# Patient Record
Sex: Female | Born: 1970 | Race: White | Hispanic: No | Marital: Married | Smoking: Current every day smoker
Health system: Southern US, Community
[De-identification: ages and names within clinical notes are randomized; demographics above are authoritative.]

## PROBLEM LIST (undated history)

## (undated) DIAGNOSIS — E559 Vitamin D deficiency, unspecified: Secondary | ICD-10-CM

## (undated) DIAGNOSIS — I340 Nonrheumatic mitral (valve) insufficiency: Secondary | ICD-10-CM

## (undated) DIAGNOSIS — I10 Essential (primary) hypertension: Secondary | ICD-10-CM

## (undated) HISTORY — PX: CYST EXCISION: SHX5701

## (undated) HISTORY — DX: Vitamin D deficiency, unspecified: E55.9

## (undated) HISTORY — PX: ABDOMINAL HYSTERECTOMY: SHX81

## (undated) HISTORY — DX: Essential (primary) hypertension: I10

## (undated) HISTORY — PX: TUBAL LIGATION: SHX77

---

## 2014-07-23 ENCOUNTER — Other Ambulatory Visit (HOSPITAL_COMMUNITY): Payer: Self-pay | Admitting: Family Medicine

## 2014-07-23 DIAGNOSIS — Z1231 Encounter for screening mammogram for malignant neoplasm of breast: Secondary | ICD-10-CM

## 2014-07-27 ENCOUNTER — Ambulatory Visit (HOSPITAL_COMMUNITY)
Admission: RE | Admit: 2014-07-27 | Discharge: 2014-07-27 | Disposition: A | Discharge status: Home / Self Care | Payer: BC Managed Care – PPO | Source: Ambulatory Visit | Attending: Family Medicine | Admitting: Family Medicine

## 2014-07-27 DIAGNOSIS — Z1231 Encounter for screening mammogram for malignant neoplasm of breast: Secondary | ICD-10-CM | POA: Insufficient documentation

## 2014-09-23 ENCOUNTER — Emergency Department (HOSPITAL_COMMUNITY): Payer: BLUE CROSS/BLUE SHIELD

## 2014-09-23 ENCOUNTER — Encounter (HOSPITAL_COMMUNITY): Payer: Self-pay | Admitting: Emergency Medicine

## 2014-09-23 ENCOUNTER — Emergency Department (HOSPITAL_COMMUNITY)
Admission: EM | Admit: 2014-09-23 | Discharge: 2014-09-24 | Disposition: A | Discharge status: Home / Self Care | Payer: BLUE CROSS/BLUE SHIELD | Attending: Emergency Medicine | Admitting: Emergency Medicine

## 2014-09-23 DIAGNOSIS — Y998 Other external cause status: Secondary | ICD-10-CM | POA: Diagnosis not present

## 2014-09-23 DIAGNOSIS — S80211A Abrasion, right knee, initial encounter: Secondary | ICD-10-CM | POA: Diagnosis not present

## 2014-09-23 DIAGNOSIS — W108XXA Fall (on) (from) other stairs and steps, initial encounter: Secondary | ICD-10-CM | POA: Diagnosis not present

## 2014-09-23 DIAGNOSIS — F1010 Alcohol abuse, uncomplicated: Secondary | ICD-10-CM | POA: Diagnosis not present

## 2014-09-23 DIAGNOSIS — H1132 Conjunctival hemorrhage, left eye: Secondary | ICD-10-CM | POA: Diagnosis not present

## 2014-09-23 DIAGNOSIS — Y9289 Other specified places as the place of occurrence of the external cause: Secondary | ICD-10-CM | POA: Insufficient documentation

## 2014-09-23 DIAGNOSIS — S199XXA Unspecified injury of neck, initial encounter: Secondary | ICD-10-CM | POA: Insufficient documentation

## 2014-09-23 DIAGNOSIS — Z23 Encounter for immunization: Secondary | ICD-10-CM | POA: Diagnosis not present

## 2014-09-23 DIAGNOSIS — S80212A Abrasion, left knee, initial encounter: Secondary | ICD-10-CM | POA: Diagnosis not present

## 2014-09-23 DIAGNOSIS — S0031XA Abrasion of nose, initial encounter: Secondary | ICD-10-CM | POA: Insufficient documentation

## 2014-09-23 DIAGNOSIS — M542 Cervicalgia: Secondary | ICD-10-CM

## 2014-09-23 DIAGNOSIS — Y9389 Activity, other specified: Secondary | ICD-10-CM | POA: Insufficient documentation

## 2014-09-23 DIAGNOSIS — S50811A Abrasion of right forearm, initial encounter: Secondary | ICD-10-CM | POA: Diagnosis not present

## 2014-09-23 DIAGNOSIS — W19XXXA Unspecified fall, initial encounter: Secondary | ICD-10-CM

## 2014-09-23 LAB — CARBOXYHEMOGLOBIN
Carboxyhemoglobin: 6.6 % (ref 0.5–1.5)
Methemoglobin: 1 % (ref 0.0–1.5)
O2 SAT: 94.2 %
Total hemoglobin: 16.6 g/dL — ABNORMAL HIGH (ref 12.0–16.0)

## 2014-09-23 LAB — COMPREHENSIVE METABOLIC PANEL
ALK PHOS: 57 U/L (ref 39–117)
ALT: 44 U/L — ABNORMAL HIGH (ref 0–35)
ANION GAP: 17 — AB (ref 5–15)
AST: 36 U/L (ref 0–37)
Albumin: 4.2 g/dL (ref 3.5–5.2)
BUN: 11 mg/dL (ref 6–23)
CO2: 17 mmol/L — ABNORMAL LOW (ref 19–32)
Calcium: 9.3 mg/dL (ref 8.4–10.5)
Chloride: 107 mEq/L (ref 96–112)
Creatinine, Ser: 0.78 mg/dL (ref 0.50–1.10)
GFR calc non Af Amer: >90 mL/min (ref >90)
GLUCOSE: 88 mg/dL (ref 70–99)
Potassium: 3.2 mmol/L — ABNORMAL LOW (ref 3.5–5.1)
Sodium: 141 mmol/L (ref 135–145)
Total Bilirubin: 0.3 mg/dL (ref 0.3–1.2)
Total Protein: 8.3 g/dL (ref 6.0–8.3)

## 2014-09-23 LAB — CBC WITH DIFFERENTIAL/PLATELET
BASOS ABS: 0 10*3/uL (ref 0.0–0.1)
BASOS PCT: 0 % (ref 0–1)
Eosinophils Absolute: 0.1 10*3/uL (ref 0.0–0.7)
Eosinophils Relative: 1 % (ref 0–5)
HEMATOCRIT: 46 % (ref 36.0–46.0)
Hemoglobin: 16.1 g/dL — ABNORMAL HIGH (ref 12.0–15.0)
Lymphocytes Relative: 17 % (ref 12–46)
Lymphs Abs: 2.3 10*3/uL (ref 0.7–4.0)
MCH: 32.9 pg (ref 26.0–34.0)
MCHC: 35 g/dL (ref 30.0–36.0)
MCV: 94.1 fL (ref 78.0–100.0)
Monocytes Absolute: 0.9 10*3/uL (ref 0.1–1.0)
Monocytes Relative: 6 % (ref 3–12)
NEUTROS ABS: 10.2 10*3/uL — AB (ref 1.7–7.7)
Neutrophils Relative %: 76 % (ref 43–77)
Platelets: 236 10*3/uL (ref 150–400)
RBC: 4.89 MIL/uL (ref 3.87–5.11)
RDW: 12.7 % (ref 11.5–15.5)
WBC: 13.6 10*3/uL — ABNORMAL HIGH (ref 4.0–10.5)

## 2014-09-23 LAB — LACTIC ACID, PLASMA: Lactic Acid, Venous: 3.7 mmol/L — ABNORMAL HIGH (ref 0.5–2.2)

## 2014-09-23 MED ORDER — SODIUM CHLORIDE 0.9 % IV BOLUS (SEPSIS)
1000.0000 mL | Freq: Once | INTRAVENOUS | Status: AC
  Administered 2014-09-23: 1000 mL via INTRAVENOUS

## 2014-09-23 MED ORDER — SODIUM CHLORIDE 0.9 % IV BOLUS (SEPSIS)
1000.0000 mL | Freq: Once | INTRAVENOUS | Status: AC
  Administered 2014-09-24: 1000 mL via INTRAVENOUS

## 2014-09-23 MED ORDER — TETANUS-DIPHTH-ACELL PERTUSSIS 5-2.5-18.5 LF-MCG/0.5 IM SUSP
0.5000 mL | Freq: Once | INTRAMUSCULAR | Status: AC
  Administered 2014-09-24: 0.5 mL via INTRAMUSCULAR
  Filled 2014-09-23: qty 0.5
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
## 2014-09-23 NOTE — ED Notes (Signed)
Pt monitored by pulse ox, bp cuff, and 12-lead. Pt placed on 15LPM non-rebreather per RN.

## 2014-09-23 NOTE — ED Notes (Signed)
Pt remains on non-rebreather at this time

## 2014-09-24 LAB — ETHANOL: ALCOHOL ETHYL (B): 164 mg/dL — AB (ref 0–9)

## 2014-09-24 MED ORDER — ONDANSETRON HCL 4 MG/2ML IJ SOLN
4.0000 mg | Freq: Once | INTRAMUSCULAR | Status: AC
  Administered 2014-09-24: 4 mg via INTRAVENOUS
  Filled 2014-09-24: qty 2

## 2014-09-24 MED ORDER — IBUPROFEN 400 MG PO TABS
600.0000 mg | ORAL_TABLET | Freq: Once | ORAL | Status: AC
  Administered 2014-09-24: 600 mg via ORAL
  Filled 2014-09-24 (×2): qty 1

## 2014-09-24 MED ORDER — POTASSIUM CHLORIDE CRYS ER 20 MEQ PO TBCR
40.0000 meq | EXTENDED_RELEASE_TABLET | Freq: Once | ORAL | Status: AC
  Administered 2014-09-24: 40 meq via ORAL
  Filled 2014-09-24: qty 2

## 2014-09-24 NOTE — Discharge Instructions (Signed)
Wear your cervical collar at all times.  If you no longer have neck pain you may remove your collar, however if it does not improve see your doctor in 1 week. Cervical Sprain A cervical sprain is an injury in the neck in which the strong, fibrous tissues (ligaments) that connect your neck bones stretch or tear. Cervical sprains can range from mild to severe. Severe cervical sprains can cause the neck vertebrae to be unstable. This can lead to damage of the spinal cord and can result in serious nervous system problems. The amount of time it takes for a cervical sprain to get better depends on the cause and extent of the injury. Most cervical sprains heal in 1 to 3 weeks. CAUSES  Severe cervical sprains may be caused by:   Contact sport injuries (such as from football, rugby, wrestling, hockey, auto racing, gymnastics, diving, martial arts, or boxing).   Motor vehicle collisions.   Whiplash injuries. This is an injury from a sudden forward and backward whipping movement of the head and neck.  Falls.  Mild cervical sprains may be caused by:   Being in an awkward position, such as while cradling a telephone between your ear and shoulder.   Sitting in a chair that does not offer proper support.   Working at a poorly Marketing executivedesigned computer station.   Looking up or down for long periods of time.  SYMPTOMS   Pain, soreness, stiffness, or a burning sensation in the front, back, or sides of the neck. This discomfort may develop immediately after the injury or slowly, 24 hours or more after the injury.   Pain or tenderness directly in the middle of the back of the neck.   Shoulder or upper back pain.   Limited ability to move the neck.   Headache.   Dizziness.   Weakness, numbness, or tingling in the hands or arms.   Muscle spasms.   Difficulty swallowing or chewing.   Tenderness and swelling of the neck.  DIAGNOSIS  Most of the time your health care provider can  diagnose a cervical sprain by taking your history and doing a physical exam. Your health care provider will ask about previous neck injuries and any known neck problems, such as arthritis in the neck. X-rays may be taken to find out if there are any other problems, such as with the bones of the neck. Other tests, such as a CT scan or MRI, may also be needed.  TREATMENT  Treatment depends on the severity of the cervical sprain. Mild sprains can be treated with rest, keeping the neck in place (immobilization), and pain medicines. Severe cervical sprains are immediately immobilized. Further treatment is done to help with pain, muscle spasms, and other symptoms and may include:  Medicines, such as pain relievers, numbing medicines, or muscle relaxants.   Physical therapy. This may involve stretching exercises, strengthening exercises, and posture training. Exercises and improved posture can help stabilize the neck, strengthen muscles, and help stop symptoms from returning.  HOME CARE INSTRUCTIONS   Put ice on the injured area.   Put ice in a plastic bag.   Place a towel between your skin and the bag.   Leave the ice on for 15-20 minutes, 3-4 times a day.   If your injury was severe, you may have been given a cervical collar to wear. A cervical collar is a two-piece collar designed to keep your neck from moving while it heals.  Do not remove the collar unless  instructed by your health care provider.  If you have long hair, keep it outside of the collar.  Ask your health care provider before making any adjustments to your collar. Minor adjustments may be required over time to improve comfort and reduce pressure on your chin or on the back of your head.  Ifyou are allowed to remove the collar for cleaning or bathing, follow your health care provider's instructions on how to do so safely.  Keep your collar clean by wiping it with mild soap and water and drying it completely. If the collar  you have been given includes removable pads, remove them every 1-2 days and hand wash them with soap and water. Allow them to air dry. They should be completely dry before you wear them in the collar.  If you are allowed to remove the collar for cleaning and bathing, wash and dry the skin of your neck. Check your skin for irritation or sores. If you see any, tell your health care provider.  Do not drive while wearing the collar.   Only take over-the-counter or prescription medicines for pain, discomfort, or fever as directed by your health care provider.   Keep all follow-up appointments as directed by your health care provider.   Keep all physical therapy appointments as directed by your health care provider.   Make any needed adjustments to your workstation to promote good posture.   Avoid positions and activities that make your symptoms worse.   Warm up and stretch before being active to help prevent problems.  SEEK MEDICAL CARE IF:   Your pain is not controlled with medicine.   You are unable to decrease your pain medicine over time as planned.   Your activity level is not improving as expected.  SEEK IMMEDIATE MEDICAL CARE IF:   You develop any bleeding.  You develop stomach upset.  You have signs of an allergic reaction to your medicine.   Your symptoms get worse.   You develop new, unexplained symptoms.   You have numbness, tingling, weakness, or paralysis in any part of your body.  MAKE SURE YOU:   Understand these instructions.  Will watch your condition.  Will get help right away if you are not doing well or get worse. Document Released: 06/28/2007 Document Revised: 09/05/2013 Document Reviewed: 03/08/2013 [facility] Patient Information 2015 [place], [place]. This information is not intended to replace advice given to you by your health care provider. Make sure you discuss any questions you have with your health care provider.

## 2015-03-15 DIAGNOSIS — I340 Nonrheumatic mitral (valve) insufficiency: Secondary | ICD-10-CM

## 2015-03-15 HISTORY — DX: Nonrheumatic mitral (valve) insufficiency: I34.0

## 2015-05-06 ENCOUNTER — Encounter: Payer: Self-pay | Admitting: Obstetrics and Gynecology

## 2015-05-06 ENCOUNTER — Ambulatory Visit (INDEPENDENT_AMBULATORY_CARE_PROVIDER_SITE_OTHER): Payer: BLUE CROSS/BLUE SHIELD | Admitting: Obstetrics and Gynecology

## 2015-05-06 VITALS — BP 120/74 | Ht 64.0 in | Wt 154.0 lb

## 2015-05-06 DIAGNOSIS — N941 Dyspareunia: Secondary | ICD-10-CM

## 2015-05-06 DIAGNOSIS — N812 Incomplete uterovaginal prolapse: Secondary | ICD-10-CM | POA: Diagnosis not present

## 2015-05-06 DIAGNOSIS — IMO0002 Reserved for concepts with insufficient information to code with codable children: Secondary | ICD-10-CM

## 2015-05-06 NOTE — Progress Notes (Signed)
[facility] Clinic Visit  Patient name: Natalie Butler MRN [id number]  Date of birth: 13-Jun-1971  CC & HPI:  Natalie Butler is a 44 y.o. female presenting today for dyspareunia that has been problematic for about 5 years. Patient was sent here by Dr. Lilly Cove. Patient states the pain feels like her partner is hitting something inside, and the discomfort stays unchanged throughout intercourse. She notes even wearing tampons makes it feel like something inside her is being hit. Patient's partner has been using Cialis. Patient herself hasn't tried any treatments or medications for her problems.    Patient has had 4 children ([id number]), delivered vaginally; 3 were delivered early at 35 weeks, with the lightest at 6 lbs.   Patient notes occasional stress incontinence with sneezing or coughing; otherwise, she denies any urinary symptoms.   Patient reports menstrual cramping during the first 2-3 days with her periods that last 4-7 days on average. She notes blood clots for 3-4 days.  She denies menorrhagia.   Patient's last normal pap smear was in November 2015 by her PCP Dr. Theodis Shove at [facility].   She states she has no children with her current partner.   ROS:  A complete review of systems was obtained and all systems are negative except as noted in the HPI and PMH.   +occasional stress incontinence +menstrual cramping +dyspareunia  Pertinent History Reviewed:   Reviewed: Significant for tubal ligation Medical         Past Medical History  Diagnosis Date  . Hypertension                               Surgical Hx:    Past Surgical History  Procedure Laterality Date  . Tubal ligation    . Cyst excision     Medications: Reviewed & Updated - see associated section                       Current outpatient prescriptions:  .  Cholecalciferol (VITAMIN D PO), Take 1 tablet by mouth daily., Disp: , Rfl:  .  lisinopril (PRINIVIL,ZESTRIL) 2.5 MG tablet, Take 2.5 mg  by mouth daily., Disp: , Rfl:    Social History: Reviewed -  reports that she has been smoking Cigarettes.  She has a 15 pack-year smoking history. She has never used smokeless tobacco.  Objective Findings:  Vitals: Blood pressure 120/74, height  (1.626 m), weight 154 lb (69.854 kg), last menstrual period 04/15/2014.  Physical Examination: General appearance - alert, well appearing, and in no distress, oriented to person, place, and time and normal appearing weight Mental status - alert, oriented to person, place, and time, normal mood, behavior, speech, dress, motor activity, and thought processes, affect appropriate to mood Pelvic - normal external genitalia  VULVA: normal appearing vulva with no masses, tenderness or lesions,  VAGINA: normal appearing vagina with normal color and discharge, no lesions,  CERVIX: normal appearing cervix without discharge or lesions, appears 4 cm wide  UTERUS: uterus is anteflexed, normal size, first degree uterine descensus ADNEXA: normal adnexa in size, nontender and no masses  Assessment & Plan:   A:  1. First degree uterine descensus 2. Dyspareunia due to uterine contact  P:  1. Discuss anatomy, position changes  2. Consider surgical options if pain considered debilitating 3 vag u/s in 1 wk  This chart was SCRIBED for Christin Bach, MD  by Ronney Lion, ED Scribe. This patient was seen in room 1, and the patient's care was started at 10:27 AM.  I personally performed the services described in this documentation, which was SCRIBED in my presence. The recorded information has been reviewed and considered accurate. It has been edited as necessary during review. Tilda Burrow, MD

## 2015-05-06 NOTE — Progress Notes (Signed)
Patient ID: Natalie Butler, female   DOB: Nov 08, 1970, 44 y.o.   MRN: [id number] Pt here today for painful intercourse. Pt states that she has had this for about 3 years. Pt states that things are much worse now than before. Pt states that she has not tried anything, as far as medications to help with this problem.

## 2015-05-08 DIAGNOSIS — IMO0002 Reserved for concepts with insufficient information to code with codable children: Secondary | ICD-10-CM | POA: Insufficient documentation

## 2015-05-10 ENCOUNTER — Other Ambulatory Visit: Payer: Self-pay | Admitting: Obstetrics and Gynecology

## 2015-05-10 DIAGNOSIS — IMO0002 Reserved for concepts with insufficient information to code with codable children: Secondary | ICD-10-CM

## 2015-05-13 ENCOUNTER — Ambulatory Visit (INDEPENDENT_AMBULATORY_CARE_PROVIDER_SITE_OTHER): Payer: BLUE CROSS/BLUE SHIELD | Admitting: Obstetrics and Gynecology

## 2015-05-13 ENCOUNTER — Ambulatory Visit (INDEPENDENT_AMBULATORY_CARE_PROVIDER_SITE_OTHER): Payer: BLUE CROSS/BLUE SHIELD

## 2015-05-13 ENCOUNTER — Encounter: Payer: Self-pay | Admitting: Obstetrics and Gynecology

## 2015-05-13 ENCOUNTER — Other Ambulatory Visit: Payer: Self-pay | Admitting: Obstetrics and Gynecology

## 2015-05-13 VITALS — BP 140/92 | Ht 64.0 in | Wt 156.0 lb

## 2015-05-13 DIAGNOSIS — IMO0002 Reserved for concepts with insufficient information to code with codable children: Secondary | ICD-10-CM

## 2015-05-13 DIAGNOSIS — N941 Dyspareunia: Secondary | ICD-10-CM

## 2015-05-13 NOTE — Progress Notes (Signed)
Patient ID: Natalie Butler, female   DOB: February 29, 1971, 44 y.o.   MRN: [id number]   [facility] Visit  Patient name: Natalie Butler MRN [id number]  Date of birth: [birth year]  CC & HPI:  Natalie Butler is a 44 y.o. female presenting today for follow-up on tests completed for dyspareunia and consult regarding having a hysterectomy. Pt denies any new problems. Pt denies ongoing problems with urinary incontinency, however, reports experiencing mild urinary incontinency with laughing, coughing and sneezing. Pt denies her urinary Sx necessitate the use of pads.   Pt reports she started her menstrual cycle today.   ROS:  10 Systems reviewed and all are negative for acute change except as noted in the HPI. Pertinent History Reviewed:   Reviewed: Significant for tubal ligation and cyst excision.  Medical         Past Medical History  Diagnosis Date  . Hypertension                               Surgical Hx:    Past Surgical History  Procedure Laterality Date  . Tubal ligation    . Cyst excision     Medications: Reviewed & Updated - see associated section                       Current outpatient prescriptions:  .  Cholecalciferol (VITAMIN D PO), Take 1 tablet by mouth daily., Disp: , Rfl:  .  lisinopril (PRINIVIL,ZESTRIL) 2.5 MG tablet, Take 2.5 mg by mouth daily., Disp: , Rfl:   Social History: Reviewed -  reports that she has been smoking Cigarettes.  She has a 15 pack-year smoking history. She has never used smokeless tobacco.  Objective Findings:  Vitals: Blood pressure 140/92, height  (1.626 m), weight 156 lb (70.761 kg), last menstrual period 04/15/2014.  Office visit only: consult for hysterectomy.   Assessment & Plan:   A:   1. Ultrasound results shows small anterior uterus with normal endometrium, tubes and ovaries  2. Benefits and drawbacks of Total Abdominal Hysterectomy with Bilateral Salpingo-Oophorectomy.   P:  1. Hysterectomy to be scheduled in October.  2.  Leaving ovaries intact.  I personally performed the services described in this documentation, which was SCRIBED in my presence. The recorded information has been reviewed and considered accurate. It has been edited as necessary during review. Tilda Burrow, MD   This chart was SCRIBED for Christin Bach, MD by Marica Otter, ED Scribe. This patient was seen in the office, and the patient's care was started at 1:57 PM.  I personally performed the services described in this documentation, which was SCRIBED in my presence. The recorded information has been reviewed and considered accurate. It has been edited as necessary during review. Tilda Burrow, MD

## 2015-05-13 NOTE — Progress Notes (Signed)
US PELVIS:normal anteverted uterus,EEC 6.1mm,normal ov's bilat,no free fluid,mult.small nabothian cysts,ov's appear to be mobile,pelvic pain during ultrasound

## 2015-05-13 NOTE — Progress Notes (Signed)
Patient ID: Natalie Butler, female   DOB: 09-22-1970, 44 y.o.   MRN: [id number] Pt here today for follow up. Pt to go over results and find out what next step is.

## 2015-05-22 ENCOUNTER — Ambulatory Visit (INDEPENDENT_AMBULATORY_CARE_PROVIDER_SITE_OTHER): Payer: BLUE CROSS/BLUE SHIELD | Admitting: Obstetrics and Gynecology

## 2015-05-22 ENCOUNTER — Other Ambulatory Visit: Payer: Self-pay | Admitting: Obstetrics and Gynecology

## 2015-05-22 ENCOUNTER — Telehealth: Payer: Self-pay | Admitting: Obstetrics and Gynecology

## 2015-05-22 ENCOUNTER — Encounter: Payer: Self-pay | Admitting: Obstetrics and Gynecology

## 2015-05-22 VITALS — BP 130/82 | Ht 64.0 in | Wt 156.5 lb

## 2015-05-22 VITALS — BP 130/92

## 2015-05-22 DIAGNOSIS — N939 Abnormal uterine and vaginal bleeding, unspecified: Secondary | ICD-10-CM

## 2015-05-22 DIAGNOSIS — N920 Excessive and frequent menstruation with regular cycle: Secondary | ICD-10-CM

## 2015-05-22 DIAGNOSIS — Z01818 Encounter for other preprocedural examination: Secondary | ICD-10-CM

## 2015-05-22 DIAGNOSIS — Z3200 Encounter for pregnancy test, result unknown: Secondary | ICD-10-CM

## 2015-05-22 NOTE — Progress Notes (Signed)
This chart was scribed for Tilda Burrow, MD by Jarvis Morgan, ED Scribe. This patient was seen in room 1 and the patient's care was started at 12:09 PM.   [facility] Clinic Visit  Patient name: Natalie Butler MRN [id number]  Date of birth: 1971-07-29  CC & HPI:  Natalie Butler is a 44 y.o. female presenting today for endometrial biopsy. Pt was unable to void urine at this visit. UPT was not run. Pt has h/o tubal ligation. Her menstrual periods are regular. Pt has been sexually active since her last menstrual period. [place] on 8/29 was negative. She denies any other symptoms.  LMP 8/29 ROS:  10 Systems reviewed and all are negative for acute change except as noted in the HPI. Alcohol intake is 1 drink, 1-2x per week   Pertinent History Reviewed:   Reviewed: Significant for HTN and tubal ligation Medical         Past Medical History  Diagnosis Date  . Hypertension                               Surgical Hx:    Past Surgical History  Procedure Laterality Date  . Tubal ligation    . Cyst excision     Medications: Reviewed & Updated - see associated section                       Current outpatient prescriptions:  .  Cholecalciferol (VITAMIN D PO), Take 1 tablet by mouth daily., Disp: , Rfl:  .  lisinopril (PRINIVIL,ZESTRIL) 2.5 MG tablet, Take 2.5 mg by mouth daily., Disp: , Rfl:    Social History: Reviewed -  reports that she has been smoking Cigarettes.  She has a 15 pack-year smoking history. She has never used smokeless tobacco.  Objective Findings:  Vitals: Blood pressure 130/82, height  (1.626 m), weight 156 lb 8 oz (70.988 kg), last menstrual period 05/13/2015.  Endometrial Biopsy: Patient given informed consent, signed copy in the chart, time out was performed. Time out taken. The patient was placed in the lithotomy position and the cervix brought into view with sterile speculum.  Portio of cervix cleansed x 2 with betadine swabs.  A tenaculum was placed in the anterior  lip of the cervix. The uterus was sounded for depth of 9 cm Milex uterine Explora 3 mm was introduced to into the uterus, suction created,  and an endometrial sample was obtained. All equipment was removed and accounted for.   The patient tolerated the procedure well.    Patient given post procedure instructions.  Followup: by phone   Assessment & Plan:   A:  1. Ultrasound results shows small anterior uterus with normal endometrium, tubes and ovaries 2. Endometrial biopsy done today  P:  1. Will schedule vaginal hysterectomy for sometime after 06/03/15 2. Will call with results of endometrial biopsy by 05/27/15  .I personally performed the services described in this documentation, which was SCRIBED in my presence. The recorded information has been reviewed and considered accurate. It has been edited as necessary during review. Tilda Burrow, MD

## 2015-05-22 NOTE — Telephone Encounter (Signed)
Pt states that she is still bleeding pretty heavy and she is having pain on her left side.

## 2015-05-22 NOTE — Telephone Encounter (Signed)
Pt advised to come in to the office now and be seen again.

## 2015-05-22 NOTE — Progress Notes (Signed)
This chart was scribed for Tilda Burrow, MD by Jarvis Morgan, ED Scribe. This patient was seen in room 1 and the patient's care was started at 3:02 PM.    [facility] Clinic Visit  Patient name: Natalie Butler MRN [id number]  Date of birth: [birth year]  CC & HPI:  Natalie Butler is a 44 y.o. female presenting today for vaginal bleeding s/p endometrial biopsy this AM. Pt has h/o tubal ligation. Her menstrual periods are regular. Pt has been sexually active since her last menstrual period. [place] on 8/29 was negative. She denies any other symptoms.  LMP 8/29   ROS:  10 Systems reviewed and all are negative for acute change except as noted in the HPI.  Pertinent History Reviewed:   Reviewed: Significant for HTN Medical         Past Medical History  Diagnosis Date  . Hypertension                               Surgical Hx:    Past Surgical History  Procedure Laterality Date  . Tubal ligation    . Cyst excision     Medications: Reviewed & Updated - see associated section                       Current outpatient prescriptions:  .  Cholecalciferol (VITAMIN D PO), Take 1 tablet by mouth daily., Disp: , Rfl:  .  lisinopril (PRINIVIL,ZESTRIL) 2.5 MG tablet, Take 2.5 mg by mouth daily., Disp: , Rfl:    Social History: Reviewed -  reports that she has been smoking Cigarettes.  She has a 15 pack-year smoking history. She has never used smokeless tobacco.  Objective Findings:  Vitals: Blood pressure 130/92, last menstrual period 05/13/2015.  Physical Examination: General appearance - alert, well appearing, and in no distress Pelvic - normal external genitalia VAGINA: normal appearing vagina with normal color. Minimal blood present no clots.  Impression normal post op s/p endometrial biospy   Assessment & Plan:   A:  1. S/p endometrial biopsy resolved bleeding P:  1. F/u per prior directions  I personally performed the services described in this documentation, which was SCRIBED in  my presence. The recorded information has been reviewed and considered accurate. It has been edited as necessary during review. Tilda Burrow, MD

## 2015-05-22 NOTE — Progress Notes (Signed)
Patient ID: Natalie Butler, female   DOB: 01-Aug-1971, 44 y.o.   MRN: [id number] Pt here today for Endometrial biopsy. Pt states that she is unable to void at this time. UPT was not able to be collected.

## 2015-05-28 ENCOUNTER — Telehealth: Payer: Self-pay | Admitting: *Deleted

## 2015-05-28 NOTE — Telephone Encounter (Signed)
Pt called wanting results of biopsy. Pt is aware that her results were normal and negative. Pt wanted to know what the next step is and what else does she need to do before her surgery.

## 2015-06-12 ENCOUNTER — Other Ambulatory Visit: Payer: Self-pay | Admitting: Obstetrics and Gynecology

## 2015-06-13 ENCOUNTER — Encounter (HOSPITAL_COMMUNITY): Payer: Self-pay

## 2015-06-13 ENCOUNTER — Encounter (HOSPITAL_COMMUNITY)
Admission: RE | Admit: 2015-06-13 | Discharge: 2015-06-13 | Disposition: A | Discharge status: Home / Self Care | Payer: BLUE CROSS/BLUE SHIELD | Source: Ambulatory Visit | Attending: Obstetrics and Gynecology | Admitting: Obstetrics and Gynecology

## 2015-06-13 DIAGNOSIS — N941 Dyspareunia: Secondary | ICD-10-CM | POA: Diagnosis not present

## 2015-06-13 DIAGNOSIS — Z01818 Encounter for other preprocedural examination: Secondary | ICD-10-CM | POA: Diagnosis present

## 2015-06-13 HISTORY — DX: Nonrheumatic mitral (valve) insufficiency: I34.0

## 2015-06-13 LAB — CBC
HEMATOCRIT: 42.8 % (ref 36.0–46.0)
Hemoglobin: 15.2 g/dL — ABNORMAL HIGH (ref 12.0–15.0)
MCH: 34.7 pg — AB (ref 26.0–34.0)
MCHC: 35.5 g/dL (ref 30.0–36.0)
MCV: 97.7 fL (ref 78.0–100.0)
Platelets: 241 10*3/uL (ref 150–400)
RBC: 4.38 MIL/uL (ref 3.87–5.11)
RDW: 12.2 % (ref 11.5–15.5)
WBC: 10.4 10*3/uL (ref 4.0–10.5)

## 2015-06-13 LAB — TYPE AND SCREEN
ABO/RH(D): A POS
ANTIBODY SCREEN: NEGATIVE

## 2015-06-13 LAB — COMPREHENSIVE METABOLIC PANEL
ALBUMIN: 4.1 g/dL (ref 3.5–5.0)
ALK PHOS: 49 U/L (ref 38–126)
ALT: 22 U/L (ref 14–54)
ANION GAP: 7 (ref 5–15)
AST: 17 U/L (ref 15–41)
BILIRUBIN TOTAL: 0.3 mg/dL (ref 0.3–1.2)
BUN: 18 mg/dL (ref 6–20)
CALCIUM: 8.8 mg/dL — AB (ref 8.9–10.3)
CO2: 23 mmol/L (ref 22–32)
Chloride: 107 mmol/L (ref 101–111)
Creatinine, Ser: 0.8 mg/dL (ref 0.44–1.00)
Glucose, Bld: 102 mg/dL — ABNORMAL HIGH (ref 65–99)
POTASSIUM: 4.4 mmol/L (ref 3.5–5.1)
Sodium: 137 mmol/L (ref 135–145)
TOTAL PROTEIN: 7.6 g/dL (ref 6.5–8.1)

## 2015-06-13 LAB — HCG, SERUM, QUALITATIVE: PREG SERUM: NEGATIVE

## 2015-06-13 LAB — URINALYSIS, ROUTINE W REFLEX MICROSCOPIC
Bilirubin Urine: NEGATIVE
GLUCOSE, UA: NEGATIVE mg/dL
Hgb urine dipstick: NEGATIVE
KETONES UR: NEGATIVE mg/dL
Leukocytes, UA: NEGATIVE
Nitrite: NEGATIVE
PH: 6 (ref 5.0–8.0)
Protein, ur: NEGATIVE mg/dL
SPECIFIC GRAVITY, URINE: 1.025 (ref 1.005–1.030)
Urobilinogen, UA: 0.2 mg/dL (ref 0.0–1.0)

## 2015-06-13 NOTE — Patient Instructions (Signed)
KENEISHA HECKART  06/13/2015     @   Your procedure is scheduled on 06/18/2015.  Report to [facility] at 6:15 A.M.  Call this number if you have problems the morning of surgery:  [phone]   Remember:  Do not eat food or drink liquids after midnight.  Take these medicines the morning of surgery with A SIP OF WATER   Lisinopril   Do not wear jewelry, make-up or nail polish.  Do not wear lotions, powders, or perfumes.  You may wear deodorant.  Do not shave 48 hours prior to surgery.  Men may shave face and neck.  Do not bring valuables to the hospital.  [facility] is not responsible for any belongings or valuables.  Contacts, dentures or bridgework may not be worn into surgery.  Leave your suitcase in the car.  After surgery it may be brought to your room.  For patients admitted to the hospital, discharge time will be determined by your treatment team.  Patients discharged the day of surgery will not be allowed to drive home.   Name and phone number of your driver:    Special instructions:  N/a  Please read over the following fact sheets that you were given. Care and Recovery After Surgery      Laparoscopically Assisted Vaginal Hysterectomy  A laparoscopically assisted vaginal hysterectomy (LAVH) is a surgical procedure to remove the uterus and cervix, and sometimes the ovaries and fallopian tubes. During an LAVH, some of the surgical removal is done through the vagina, and the rest is done through a few small surgical cuts (incisions) in the abdomen.  This procedure is usually considered in women when a vaginal hysterectomy is not an option. Your health care provider will discuss the risks and benefits of the different surgical techniques at your appointment. Generally, recovery time is faster and there are fewer complications after laparoscopic procedures than after open incisional procedures. LET [facility] CARE PROVIDER KNOW ABOUT:   Any allergies  you have.  All medicines you are taking, including vitamins, herbs, eye drops, creams, and over-the-counter medicines.  Previous problems you or members of your family have had with the use of anesthetics.  Any blood disorders you have.  Previous surgeries you have had.  Medical conditions you have. RISKS AND COMPLICATIONS Generally, this is a safe procedure. However, as with any procedure, complications can occur. Possible complications include:  Allergies to medicines.  Difficulty breathing.  Bleeding.  Infection.  Damage to other structures near your uterus and cervix. BEFORE THE PROCEDURE  Ask your health care provider about changing or stopping your regular medicines.  Take certain medicines, such as a colon-emptying preparation, as directed.  Do not eat or drink anything for at least 8 hours before your surgery.  Stop smoking if you smoke. Stopping will improve your health after surgery.  Arrange for a ride home after surgery and for help at home during recovery. PROCEDURE   An IV tube will be put into one of your veins in order to give you fluids and medicines.  You will receive medicines to relax you and medicines that make you sleep (general anesthetic).  You may have a flexible tube (catheter) put into your bladder to drain urine.  You may have a tube put through your nose or mouth that goes into your stomach (nasogastric tube). The nasogastric tube removes digestive fluids and prevents you from feeling nauseated and from vomiting.  Tight-fitting (compression) stockings will be placed  on your legs to promote circulation.  Three to four small incisions will be made in your abdomen. An incision also will be made in your vagina. Probes and tools will be inserted into the small incisions. The uterus and cervix are removed (and possibly your ovaries and fallopian tubes) through your vagina as well as through the small incisions that were made in the  abdomen.  Your vagina is then sewn back to normal. AFTER THE PROCEDURE  You may have a liquid diet temporarily. You will most likely return to, and tolerate, your usual diet the day after surgery.  You will be passing urine through a catheter. It will be removed the day after surgery.  Your temperature, breathing rate, heart rate, blood pressure, and oxygen level will be monitored regularly.  You will still wear compression stockings on your legs until you are able to move around.  You will use a special device or do breathing exercises to keep your lungs clear.  You will be encouraged to walk as soon as possible. Document Released: 08/20/2011 Document Revised: 05/03/2013 Document Reviewed: 03/16/2013 [facility] Patient Information 2015 [place], [place]. This information is not intended to replace advice given to you by your health care provider. Make sure you discuss any questions you have with your health care provider.

## 2015-06-18 ENCOUNTER — Encounter (HOSPITAL_COMMUNITY)
Admission: RE | Disposition: A | Discharge status: Home / Self Care | Payer: Self-pay | Source: Ambulatory Visit | Attending: Obstetrics and Gynecology

## 2015-06-18 ENCOUNTER — Ambulatory Visit (HOSPITAL_COMMUNITY): Payer: BLUE CROSS/BLUE SHIELD | Admitting: Anesthesiology

## 2015-06-18 ENCOUNTER — Observation Stay (HOSPITAL_COMMUNITY)
Admission: RE | Admit: 2015-06-18 | Discharge: 2015-06-19 | Disposition: A | Discharge status: Home / Self Care | Payer: BLUE CROSS/BLUE SHIELD | Source: Ambulatory Visit | Attending: Obstetrics and Gynecology | Admitting: Obstetrics and Gynecology

## 2015-06-18 ENCOUNTER — Encounter (HOSPITAL_COMMUNITY): Payer: Self-pay | Admitting: *Deleted

## 2015-06-18 DIAGNOSIS — I10 Essential (primary) hypertension: Secondary | ICD-10-CM | POA: Diagnosis not present

## 2015-06-18 DIAGNOSIS — Z9851 Tubal ligation status: Secondary | ICD-10-CM | POA: Insufficient documentation

## 2015-06-18 DIAGNOSIS — I340 Nonrheumatic mitral (valve) insufficiency: Secondary | ICD-10-CM | POA: Diagnosis not present

## 2015-06-18 DIAGNOSIS — Z79899 Other long term (current) drug therapy: Secondary | ICD-10-CM | POA: Insufficient documentation

## 2015-06-18 DIAGNOSIS — Z90710 Acquired absence of both cervix and uterus: Secondary | ICD-10-CM | POA: Diagnosis present

## 2015-06-18 DIAGNOSIS — N812 Incomplete uterovaginal prolapse: Secondary | ICD-10-CM

## 2015-06-18 DIAGNOSIS — F17210 Nicotine dependence, cigarettes, uncomplicated: Secondary | ICD-10-CM | POA: Insufficient documentation

## 2015-06-18 DIAGNOSIS — N736 Female pelvic peritoneal adhesions (postinfective): Secondary | ICD-10-CM | POA: Diagnosis not present

## 2015-06-18 DIAGNOSIS — N941 Unspecified dyspareunia: Secondary | ICD-10-CM | POA: Diagnosis present

## 2015-06-18 DIAGNOSIS — N940 Mittelschmerz: Secondary | ICD-10-CM | POA: Diagnosis not present

## 2015-06-18 HISTORY — PX: VAGINAL HYSTERECTOMY: SHX2639

## 2015-06-18 SURGERY — HYSTERECTOMY, VAGINAL
Anesthesia: General

## 2015-06-18 MED ORDER — OXYCODONE-ACETAMINOPHEN 5-325 MG PO TABS: 1.0000 | ORAL_TABLET | ORAL | Status: DC | PRN

## 2015-06-18 MED ORDER — ONDANSETRON HCL 4 MG/2ML IJ SOLN: 4.0000 mg | Freq: Once | INTRAMUSCULAR | Status: DC | PRN

## 2015-06-18 MED ORDER — LIDOCAINE HCL 1 % IJ SOLN
INTRAMUSCULAR | Status: DC | PRN
  Administered 2015-06-18: 25 mg via INTRADERMAL

## 2015-06-18 MED ORDER — LIDOCAINE HCL (PF) 1 % IJ SOLN
INTRAMUSCULAR | Status: AC
  Filled 2015-06-18: qty 5

## 2015-06-18 MED ORDER — GLYCOPYRROLATE 0.2 MG/ML IJ SOLN
INTRAMUSCULAR | Status: AC
  Filled 2015-06-18: qty 1

## 2015-06-18 MED ORDER — LISINOPRIL 5 MG PO TABS: 2.5000 mg | ORAL_TABLET | Freq: Every day | ORAL | Status: DC

## 2015-06-18 MED ORDER — ROCURONIUM BROMIDE 50 MG/5ML IV SOLN
INTRAVENOUS | Status: AC
  Filled 2015-06-18: qty 1

## 2015-06-18 MED ORDER — ONDANSETRON HCL 4 MG/2ML IJ SOLN: 4.0000 mg | Freq: Four times a day (QID) | INTRAMUSCULAR | Status: DC | PRN

## 2015-06-18 MED ORDER — SODIUM CHLORIDE 0.9 % IJ SOLN: 9.0000 mL | INTRAMUSCULAR | Status: DC | PRN

## 2015-06-18 MED ORDER — SODIUM CHLORIDE 0.9 % IJ SOLN
INTRAMUSCULAR | Status: AC
  Filled 2015-06-18: qty 10

## 2015-06-18 MED ORDER — GLYCOPYRROLATE 0.2 MG/ML IJ SOLN
INTRAMUSCULAR | Status: DC | PRN
  Administered 2015-06-18: 0.6 mg via INTRAVENOUS

## 2015-06-18 MED ORDER — ROCURONIUM BROMIDE 100 MG/10ML IV SOLN
INTRAVENOUS | Status: DC | PRN
  Administered 2015-06-18: 5 mg via INTRAVENOUS
  Administered 2015-06-18: 30 mg via INTRAVENOUS

## 2015-06-18 MED ORDER — BUPIVACAINE-EPINEPHRINE (PF) 0.5% -1:200000 IJ SOLN
INTRAMUSCULAR | Status: AC
  Filled 2015-06-18: qty 30

## 2015-06-18 MED ORDER — PANTOPRAZOLE SODIUM 40 MG PO TBEC: 40.0000 mg | DELAYED_RELEASE_TABLET | Freq: Every day | ORAL | Status: DC

## 2015-06-18 MED ORDER — SODIUM CHLORIDE 0.9 % IV SOLN
INTRAVENOUS | Status: DC
  Administered 2015-06-18: 17:00:00 via INTRAVENOUS

## 2015-06-18 MED ORDER — MIDAZOLAM HCL 5 MG/5ML IJ SOLN
INTRAMUSCULAR | Status: DC | PRN
  Administered 2015-06-18: 1 mg via INTRAVENOUS
  Administered 2015-06-18: 2 mg via INTRAVENOUS
  Administered 2015-06-18: 1 mg via INTRAVENOUS

## 2015-06-18 MED ORDER — ONDANSETRON HCL 4 MG/2ML IJ SOLN
INTRAMUSCULAR | Status: AC
  Filled 2015-06-18: qty 2

## 2015-06-18 MED ORDER — ONDANSETRON HCL 4 MG PO TABS: 4.0000 mg | ORAL_TABLET | Freq: Four times a day (QID) | ORAL | Status: DC | PRN

## 2015-06-18 MED ORDER — NALOXONE HCL 0.4 MG/ML IJ SOLN: 0.4000 mg | INTRAMUSCULAR | Status: DC | PRN

## 2015-06-18 MED ORDER — HYDROMORPHONE 0.3 MG/ML IV SOLN
INTRAVENOUS | Status: DC
  Administered 2015-06-18: 3.3 mg via INTRAVENOUS
  Administered 2015-06-18: 5 mL via INTRAVENOUS
  Administered 2015-06-18: 0.6 mg via INTRAVENOUS
  Administered 2015-06-18: 11:00:00 via INTRAVENOUS
  Filled 2015-06-18 (×2): qty 25

## 2015-06-18 MED ORDER — IBUPROFEN 600 MG PO TABS: 600.0000 mg | ORAL_TABLET | Freq: Four times a day (QID) | ORAL | Status: DC | PRN

## 2015-06-18 MED ORDER — FENTANYL CITRATE (PF) 100 MCG/2ML IJ SOLN
25.0000 ug | INTRAMUSCULAR | Status: DC | PRN
  Administered 2015-06-18 (×2): 50 ug via INTRAVENOUS
  Filled 2015-06-18: qty 2

## 2015-06-18 MED ORDER — KETOROLAC TROMETHAMINE 30 MG/ML IJ SOLN: 30.0000 mg | Freq: Four times a day (QID) | INTRAMUSCULAR | Status: DC

## 2015-06-18 MED ORDER — GLYCOPYRROLATE 0.2 MG/ML IJ SOLN
INTRAMUSCULAR | Status: AC
  Filled 2015-06-18: qty 2

## 2015-06-18 MED ORDER — MIDAZOLAM HCL 2 MG/2ML IJ SOLN
INTRAMUSCULAR | Status: AC
  Filled 2015-06-18: qty 2

## 2015-06-18 MED ORDER — KETOROLAC TROMETHAMINE 30 MG/ML IJ SOLN
30.0000 mg | Freq: Four times a day (QID) | INTRAMUSCULAR | Status: DC
  Administered 2015-06-19: 30 mg via INTRAVENOUS
  Filled 2015-06-18: qty 1

## 2015-06-18 MED ORDER — ONDANSETRON HCL 4 MG/2ML IJ SOLN
4.0000 mg | Freq: Once | INTRAMUSCULAR | Status: AC
  Administered 2015-06-18: 4 mg via INTRAVENOUS

## 2015-06-18 MED ORDER — EPHEDRINE SULFATE 50 MG/ML IJ SOLN
INTRAMUSCULAR | Status: AC
  Filled 2015-06-18: qty 1

## 2015-06-18 MED ORDER — BUPIVACAINE-EPINEPHRINE 0.5% -1:200000 IJ SOLN
INTRAMUSCULAR | Status: DC | PRN
  Administered 2015-06-18: 5 mL

## 2015-06-18 MED ORDER — CEFAZOLIN SODIUM-DEXTROSE 2-3 GM-% IV SOLR
2.0000 g | INTRAVENOUS | Status: AC
  Administered 2015-06-18: 2 g via INTRAVENOUS
  Filled 2015-06-18: qty 50

## 2015-06-18 MED ORDER — FENTANYL CITRATE (PF) 100 MCG/2ML IJ SOLN
INTRAMUSCULAR | Status: DC | PRN
  Administered 2015-06-18: 150 ug via INTRAVENOUS
  Administered 2015-06-18 (×2): 50 ug via INTRAVENOUS

## 2015-06-18 MED ORDER — DIPHENHYDRAMINE HCL 12.5 MG/5ML PO ELIX: 12.5000 mg | ORAL_SOLUTION | Freq: Four times a day (QID) | ORAL | Status: DC | PRN

## 2015-06-18 MED ORDER — LACTATED RINGERS IV SOLN
INTRAVENOUS | Status: DC
  Administered 2015-06-18 (×2): via INTRAVENOUS

## 2015-06-18 MED ORDER — MIDAZOLAM HCL 2 MG/2ML IJ SOLN
INTRAMUSCULAR | Status: AC
  Filled 2015-06-18: qty 4

## 2015-06-18 MED ORDER — PROPOFOL 10 MG/ML IV BOLUS
INTRAVENOUS | Status: AC
  Filled 2015-06-18: qty 20

## 2015-06-18 MED ORDER — MIDAZOLAM HCL 2 MG/2ML IJ SOLN
1.0000 mg | INTRAMUSCULAR | Status: DC | PRN
  Administered 2015-06-18: 2 mg via INTRAVENOUS

## 2015-06-18 MED ORDER — FENTANYL CITRATE (PF) 250 MCG/5ML IJ SOLN
INTRAMUSCULAR | Status: AC
  Filled 2015-06-18: qty 25

## 2015-06-18 MED ORDER — KETOROLAC TROMETHAMINE 30 MG/ML IJ SOLN: 30.0000 mg | Freq: Once | INTRAMUSCULAR | Status: DC

## 2015-06-18 MED ORDER — NEOSTIGMINE METHYLSULFATE 10 MG/10ML IV SOLN
INTRAVENOUS | Status: DC | PRN
  Administered 2015-06-18: 1 mg via INTRAVENOUS
  Administered 2015-06-18: 2 mg via INTRAVENOUS

## 2015-06-18 MED ORDER — PROPOFOL 10 MG/ML IV BOLUS
INTRAVENOUS | Status: DC | PRN
  Administered 2015-06-18: 140 mg via INTRAVENOUS

## 2015-06-18 MED ORDER — DIPHENHYDRAMINE HCL 50 MG/ML IJ SOLN: 12.5000 mg | Freq: Four times a day (QID) | INTRAMUSCULAR | Status: DC | PRN

## 2015-06-18 SURGICAL SUPPLY — 34 items
BAG HAMPER (MISCELLANEOUS) ×3 IMPLANT
CLOTH BEACON ORANGE TIMEOUT ST (SAFETY) ×3 IMPLANT
COVER LIGHT HANDLE STERIS (MISCELLANEOUS) ×6 IMPLANT
DECANTER SPIKE VIAL GLASS SM (MISCELLANEOUS) ×3 IMPLANT
DRAPE PROXIMA HALF (DRAPES) ×3 IMPLANT
DRAPE STERI URO 9X17 APER PCH (DRAPES) ×3 IMPLANT
ELECT REM PT RETURN 9FT ADLT (ELECTROSURGICAL) ×3
ELECTRODE REM PT RTRN 9FT ADLT (ELECTROSURGICAL) ×1 IMPLANT
FORMALIN 10 PREFIL 480ML (MISCELLANEOUS) ×3 IMPLANT
GAUZE PACKING 2X5 YD STRL (GAUZE/BANDAGES/DRESSINGS) ×3 IMPLANT
GLOVE BIOGEL PI IND STRL 7.0 (GLOVE) ×4 IMPLANT
GLOVE BIOGEL PI IND STRL 9 (GLOVE) ×1 IMPLANT
GLOVE BIOGEL PI INDICATOR 7.0 (GLOVE) ×8
GLOVE BIOGEL PI INDICATOR 9 (GLOVE) ×2
GLOVE ECLIPSE 6.5 STRL STRAW (GLOVE) ×6 IMPLANT
GLOVE ECLIPSE 9.0 STRL (GLOVE) ×3 IMPLANT
GOWN SPEC L3 XXLG W/TWL (GOWN DISPOSABLE) ×3 IMPLANT
GOWN STRL REUS W/TWL LRG LVL3 (GOWN DISPOSABLE) ×9 IMPLANT
IV NS IRRIG 3000ML ARTHROMATIC (IV SOLUTION) ×3 IMPLANT
KIT ROOM TURNOVER AP CYSTO (KITS) ×3 IMPLANT
MANIFOLD NEPTUNE II (INSTRUMENTS) ×3 IMPLANT
NS IRRIG 1000ML POUR BTL (IV SOLUTION) ×3 IMPLANT
PACK PERI GYN (CUSTOM PROCEDURE TRAY) ×3 IMPLANT
PAD ARMBOARD 7.5X6 YLW CONV (MISCELLANEOUS) ×3 IMPLANT
SET BASIN LINEN APH (SET/KITS/TRAYS/PACK) ×3 IMPLANT
SUT CHROMIC 0 CT 1 (SUTURE) ×36 IMPLANT
SUT CHROMIC 2 0 CT 1 (SUTURE) ×6 IMPLANT
SUT CHROMIC GUT BROWN 0 54 (SUTURE) IMPLANT
SUT CHROMIC GUT BROWN 0 54IN (SUTURE)
SUT PROLENE 2 0 SH 30 (SUTURE) ×3 IMPLANT
SUT VIC AB 0 CT2 8-18 (SUTURE) IMPLANT
TRAY FOLEY CATH SILVER 16FR (SET/KITS/TRAYS/PACK) ×3 IMPLANT
VERSALIGHT (MISCELLANEOUS) ×3 IMPLANT
WATER STERILE IRR 1000ML POUR (IV SOLUTION) ×3 IMPLANT

## 2015-06-18 NOTE — H&P (Signed)
Preoperative History and Physical  Natalie Butler is a 44 y.o. No obstetric history on file. here for surgical management of dyspareunia due to uterine contact, and first degree uterine descensus.   No significant preoperative concerns. She denies significant urinary incontinence, or difficulty with defecation. Endometrial biopsy is benign, Pap smear normal. Pelvic u/s shows   Proposed surgery: Vaginal hysterectomy  Past Medical History  Diagnosis Date  . Hypertension   . Mitral valve regurgitation 03/2015   Past Surgical History  Procedure Laterality Date  . Tubal ligation    . Cyst excision     OB History  No data available  Patient denies any other pertinent gynecologic issues. She reports minimal stress incontinence and no difficulty with defecation.  No current facility-administered medications on file prior to encounter.   Current Outpatient Prescriptions on File Prior to Encounter  Medication Sig Dispense Refill  . Cholecalciferol (VITAMIN D PO) Take 1 tablet by mouth daily.    Marland Kitchen lisinopril (PRINIVIL,ZESTRIL) 2.5 MG tablet Take 2.5 mg by mouth daily.     No Known Allergies  Social History:   reports that she has been smoking Cigarettes.  She has a 15 pack-year smoking history. She has never used smokeless tobacco. She reports that she drinks about 4.2 oz of alcohol per week. She reports that she does not use illicit drugs.  Family History  Problem Relation Age of Onset  . Diabetes Mother   . Hypertension Mother   . Hyperlipidemia Mother     Review of Systems: Noncontributory  PHYSICAL EXAM: Last menstrual period 05/13/2015. General appearance - alert, well appearing, and in no distress Chest - clear to auscultation, no wheezes, rales or rhonchi, symmetric air entry Heart - normal rate and regular rhythm Abdomen - soft, nontender, nondistended, no masses or organomegaly Pelvic - examination not indicated Extremities - peripheral pulses normal, no pedal edema, no  clubbing or cyanosis  Labs: Results for orders placed or performed during the hospital encounter of 06/13/15 (from the past 336 hour(s))  CBC   Collection Time: 06/13/15  8:30 AM  Result Value Ref Range   WBC 10.4 4.0 - 10.5 K/uL   RBC 4.38 3.87 - 5.11 MIL/uL   Hemoglobin 15.2 (H) 12.0 - 15.0 g/dL   HCT 69.6 29.5 - 28.4 %   MCV 97.7 78.0 - 100.0 fL   MCH 34.7 (H) 26.0 - 34.0 pg   MCHC 35.5 30.0 - 36.0 g/dL   RDW 13.2 44.0 - 10.2 %   Platelets 241 150 - 400 K/uL  Comprehensive metabolic panel   Collection Time: 06/13/15  8:30 AM  Result Value Ref Range   Sodium 137 135 - 145 mmol/L   Potassium 4.4 3.5 - 5.1 mmol/L   Chloride 107 101 - 111 mmol/L   CO2 23 22 - 32 mmol/L   Glucose, Bld 102 (H) 65 - 99 mg/dL   BUN 18 6 - 20 mg/dL   Creatinine, Ser 7.25 0.44 - 1.00 mg/dL   Calcium 8.8 (L) 8.9 - 10.3 mg/dL   Total Protein 7.6 6.5 - 8.1 g/dL   Albumin 4.1 3.5 - 5.0 g/dL   AST 17 15 - 41 U/L   ALT 22 14 - 54 U/L   Alkaline Phosphatase 49 38 - 126 U/L   Total Bilirubin 0.3 0.3 - 1.2 mg/dL   GFR calc non Af Amer >60 >60 mL/min   GFR calc Af Amer >60 >60 mL/min   Anion gap 7 5 - 15  hCG, serum, qualitative   Collection Time: 06/13/15  8:30 AM  Result Value Ref Range   Preg, Serum NEGATIVE NEGATIVE  Urinalysis, Routine w reflex microscopic (not at [facility])   Collection Time: 06/13/15  8:30 AM  Result Value Ref Range   Color, Urine YELLOW YELLOW   APPearance CLEAR CLEAR   Specific Gravity, Urine 1.025 1.005 - 1.030   pH 6.0 5.0 - 8.0   Glucose, UA NEGATIVE NEGATIVE mg/dL   Hgb urine dipstick NEGATIVE NEGATIVE   Bilirubin Urine NEGATIVE NEGATIVE   Ketones, ur NEGATIVE NEGATIVE mg/dL   Protein, ur NEGATIVE NEGATIVE mg/dL   Urobilinogen, UA 0.2 0.0 - 1.0 mg/dL   Nitrite NEGATIVE NEGATIVE   Leukocytes, UA NEGATIVE NEGATIVE  Type and screen   Collection Time: 06/13/15  8:30 AM  Result Value Ref Range   ABO/RH(D) A POS    Antibody Screen NEG    Sample Expiration 06/26/2015    GYNECOLOGIC SONOGRAM   Natalie Butler is a 44 y.o. LMP 05/12/2016 for a pelvic sonogram for dyspareunia.  Uterus 9.25 x 5.1 x 4.09 cm, normal anteverted uterus  Endometrium 6.2 mm, symmetrical, wnl  Right ovary 2.2 x 2 x 1.8 cm, wnl  Left ovary 3.1 x 2 x 1.8 cm, wnl    Technician Comments:  US PELVIS:normal anteverted uterus,EEC 6.49mm,normal ov's bilat,no free fluid,mult.small nabothian cysts,ov's appear to be mobile,pelvic pain during ultrasound     [employer] 05/13/2015 1:36 PM  Clinical Impression and recommendations:  I have reviewed the sonogram results above.  Combined with the patient's current clinical course, below are my impressions and any appropriate recommendations for management based on the sonographic findings:  Normal sized anteflexed uterus with symmetric endometrium 2. Normal left and right ovary Impression: Normal GYN exam  Natalie Butler   Assessment: Patient Active Problem List   Diagnosis Date Noted  . Dyspareunia 05/08/2015  first degree uterine descensus  Plan: Patient will undergo surgical management with vaginal hysterectomy.  Procedure reviewed with the patient. We have discussed the pros and cons of fallopian tube removal for cancer risk reduction, and pt decided on leaving tube and ovary in place due to concern over adhesion risk.  06/18/2015 5:21 AM

## 2015-06-18 NOTE — Anesthesia Preprocedure Evaluation (Signed)
Anesthesia Evaluation  Patient identified by MRN, date of birth, ID band Patient awake    Reviewed: Allergy & Precautions, NPO status , Patient's Chart, lab work & pertinent test results  Airway Mallampati: I  TM Distance: >3 FB     Dental  (+) Teeth Intact, Partial Upper, Dental Advisory Given   Pulmonary Current Smoker (am cough),    breath sounds clear to auscultation       Cardiovascular hypertension, Pt. on medications + Valvular Problems/Murmurs MR  Rhythm:Regular Rate:Normal     Neuro/Psych    GI/Hepatic negative GI ROS,   Endo/Other    Renal/GU      Musculoskeletal   Abdominal   Peds  Hematology   Anesthesia Other Findings   Reproductive/Obstetrics                             Anesthesia Physical Anesthesia Plan  ASA: II  Anesthesia Plan: General   Post-op Pain Management:    Induction: Intravenous  Airway Management Planned: Oral ETT  Additional Equipment:   Intra-op Plan:   Post-operative Plan: Extubation in OR  Informed Consent: I have reviewed the patients History and Physical, chart, labs and discussed the procedure including the risks, benefits and alternatives for the proposed anesthesia with the patient or authorized representative who has indicated his/her understanding and acceptance.     Plan Discussed with:   Anesthesia Plan Comments:         Anesthesia Quick Evaluation

## 2015-06-18 NOTE — Op Note (Signed)
06/18/2015  9:21 AM  PATIENT:  Natalie Butler  44 y.o. female  PRE-OPERATIVE DIAGNOSIS:  dyspareunia, uterine descensus first degree  POST-OPERATIVE DIAGNOSIS:  dyspareunia, uterine descensus, first degree  PROCEDURE:  Procedure(s): HYSTERECTOMY VAGINAL (N/A)  SURGEON:  Surgeon(s) and Role:    * Tilda Burrow, MD - Primary  PHYSICIAN ASSISTANT:   ASSISTANTS: Catherine page, RNFA , Rayfield Citizen, PAS  ANESTHESIA:   local and general  EBL:  Total I/O In: 2000 [I.V.:2000] Out: 150 [Blood:150]  BLOOD ADMINISTERED:none  DRAINS: Urinary Catheter (Foley) and Vaginal pack with Betadine gauze   LOCAL MEDICATIONS USED:  MARCAINE    and Amount: 10 ml  SPECIMEN:  Source of Specimen:  Uterus and cervix  DISPOSITION OF SPECIMEN:  PATHOLOGY  COUNTS:  YES  TOURNIQUET:  * No tourniquets in log *  DICTATION: .Dragon Dictation  PLAN OF CARE: Admit for overnight observation  PATIENT DISPOSITION:  PACU - hemodynamically stable.   Delay start of Pharmacological VTE agent (>24hrs) due to surgical blood loss or risk of bleeding: not applicable Indications 44year-old female experiencing uterine contact deep thrust dyspareunia, with normal ultrasound and normal ovaries.  Details of procedure: Patient was taken to the operating room, general anesthesia introduced antibiotics administered, timeout conducted. Patient was in the standard lithotomy's support with candycane stirrups. Prepping and draping was performed and cervix grasped with single-tooth tenaculum and short weighted speculum placed in the posterior vagina the cervix was placed in traction and was noted that she had much better pelvic support on the left side than the right, on the left the lower cardinal ligament was quite firm. The cervix was circumscribed with Marcaine injections followed by Bovie cautery transection of the epithelium, and anterior the cervicovaginal avascular space was identified by sharp dissection and the bladder  elevated off anteriorly to the point that the vesicouterine peritoneum reflection could be identified, and retractor replaced to hold bladder out of the surgical field. A posterior colpotomy incision was performed identifying the cul-de-sac without difficulty and uterosacral ligaments were then clamped with Z clamps, transected and sutured and tagged for future identification. Lower cardinal ligaments on each side were grasped and small bites transected and suture ligated upper cardinal ligaments were treated similarly being careful to stay close against the uterus contained small bites. The left side did have very strong support no abnormalities could be identified. The anterior peritoneum was opened sufficiently to allow Zeppelin clamps to grasp anteriorly and posteriorly as we marched up the broad ligament taking bites that were transected and suture ligated with 0 chromic the cervix and lower uterine segment was amputated off of the uterine body to allow for visibility and the upper pedicles clamped cut and suture ligated. The utero-ovarian and round ligament pedicles were tagged for future orientation and section. Was removed. Pedicles were inspected and confirmed as hemostatic. Foley catheter was inserted revealing clear urine, anterior peritoneum could be grasped and the superficial edge of it tacked to the peritoneal surface over the posterior cul-de-sac. The cuff was then closed with a series of horizontal sutures interrupted, 0 chromic with good hemostasis achieved and good support. Patient then had Betadine soaked vaginal packing placed and went to recovery room in stable condition EBL 150 cc sponge and needle counts correct

## 2015-06-18 NOTE — Progress Notes (Signed)
Husband at bedside. Pt. States "comfortable with PCA." Napping at intervals. Tolerated soft diet well. Denies nausea.

## 2015-06-18 NOTE — Brief Op Note (Signed)
06/18/2015  9:21 AM  PATIENT:  Natalie Butler  44 y.o. female  PRE-OPERATIVE DIAGNOSIS:  dyspareunia, uterine descensus first degree  POST-OPERATIVE DIAGNOSIS:  dyspareunia, uterine descensus, first degree  PROCEDURE:  Procedure(s): HYSTERECTOMY VAGINAL (N/A)  SURGEON:  Surgeon(s) and Role:    * Tilda Burrow, MD - Primary  PHYSICIAN ASSISTANT:   ASSISTANTS: Catherine page, RNFA , Rayfield Citizen, PAS  ANESTHESIA:   local and general  EBL:  Total I/O In: 2000 [I.V.:2000] Out: 150 [Blood:150]  BLOOD ADMINISTERED:none  DRAINS: Urinary Catheter (Foley) and Vaginal pack with Betadine gauze   LOCAL MEDICATIONS USED:  MARCAINE    and Amount: 10 ml  SPECIMEN:  Source of Specimen:  Uterus and cervix  DISPOSITION OF SPECIMEN:  PATHOLOGY  COUNTS:  YES  TOURNIQUET:  * No tourniquets in log *  DICTATION: .Dragon Dictation  PLAN OF CARE: Admit for overnight observation  PATIENT DISPOSITION:  PACU - hemodynamically stable.   Delay start of Pharmacological VTE agent (>24hrs) due to surgical blood loss or risk of bleeding: not applicable

## 2015-06-18 NOTE — Anesthesia Procedure Notes (Signed)
Procedure Name: Intubation Date/Time: 06/18/2015 7:42 AM Performed by: Despina Hidden Pre-anesthesia Checklist: Patient being monitored, Suction available, Emergency Drugs available and Patient identified Patient Re-evaluated:Patient Re-evaluated prior to inductionOxygen Delivery Method: Circle system utilized Preoxygenation: Pre-oxygenation with 100% oxygen Intubation Type: IV induction Ventilation: Mask ventilation without difficulty and Oral airway inserted - appropriate to patient size Laryngoscope Size: Mac and 3 Grade View: Grade I Tube type: Oral Tube size: 7.0 mm Number of attempts: 1 Airway Equipment and Method: Stylet and Oral airway Placement Confirmation: ETT inserted through vocal cords under direct vision,  positive ETCO2 and breath sounds checked- equal and bilateral Secured at: 22 cm Tube secured with: Tape Dental Injury: Teeth and Oropharynx as per pre-operative assessment

## 2015-06-18 NOTE — Anesthesia Postprocedure Evaluation (Signed)
  Anesthesia Post-op Note  Patient: Natalie Butler  Procedure(s) Performed: Procedure(s): HYSTERECTOMY VAGINAL (N/A)  Patient Location: PACU  Anesthesia Type:General  Level of Consciousness: awake, alert , oriented and patient cooperative  Airway and Oxygen Therapy: Patient Spontanous Breathing and Patient connected to nasal cannula oxygen  Post-op Pain: 4 /10, mild  Post-op Assessment: Post-op Vital signs reviewed, Patient's Cardiovascular Status Stable, Respiratory Function Stable, Patent Airway, No signs of Nausea or vomiting and Pain level controlled              Post-op Vital Signs: Reviewed and stable  Last Vitals:  Filed Vitals:   06/18/15 0925  BP:   Pulse:   Temp: 36.6 C  Resp:     Complications: No apparent anesthesia complications

## 2015-06-18 NOTE — Transfer of Care (Signed)
Immediate Anesthesia Transfer of Care Note  Patient: Natalie Butler  Procedure(s) Performed: Procedure(s): HYSTERECTOMY VAGINAL (N/A)  Patient Location: PACU  Anesthesia Type:General  Level of Consciousness: awake and patient cooperative  Airway & Oxygen Therapy: Patient Spontanous Breathing and Patient connected to face mask oxygen  Post-op Assessment: Report given to RN, Post -op Vital signs reviewed and stable and Patient moving all extremities  Post vital signs: Reviewed and stable  Last Vitals:  Filed Vitals:   06/18/15 0729  BP: 102/61  Pulse: 81  Temp:   Resp: 18    Complications: No apparent anesthesia complications

## 2015-06-19 ENCOUNTER — Encounter (HOSPITAL_COMMUNITY): Payer: Self-pay | Admitting: Obstetrics and Gynecology

## 2015-06-19 DIAGNOSIS — N941 Unspecified dyspareunia: Secondary | ICD-10-CM | POA: Diagnosis not present

## 2015-06-19 LAB — CBC
HCT: 35.4 % — ABNORMAL LOW (ref 36.0–46.0)
Hemoglobin: 11.9 g/dL — ABNORMAL LOW (ref 12.0–15.0)
MCH: 33.2 pg (ref 26.0–34.0)
MCHC: 33.6 g/dL (ref 30.0–36.0)
MCV: 98.9 fL (ref 78.0–100.0)
PLATELETS: 180 10*3/uL (ref 150–400)
RBC: 3.58 MIL/uL — AB (ref 3.87–5.11)
RDW: 12.2 % (ref 11.5–15.5)
WBC: 11.2 10*3/uL — AB (ref 4.0–10.5)

## 2015-06-19 LAB — BASIC METABOLIC PANEL
ANION GAP: 4 — AB (ref 5–15)
BUN: 7 mg/dL (ref 6–20)
CALCIUM: 7.7 mg/dL — AB (ref 8.9–10.3)
CO2: 26 mmol/L (ref 22–32)
Chloride: 107 mmol/L (ref 101–111)
Creatinine, Ser: 0.57 mg/dL (ref 0.44–1.00)
GLUCOSE: 103 mg/dL — AB (ref 65–99)
POTASSIUM: 3.6 mmol/L (ref 3.5–5.1)
SODIUM: 137 mmol/L (ref 135–145)

## 2015-06-19 MED ORDER — KETOROLAC TROMETHAMINE 10 MG PO TABS: 10.0000 mg | ORAL_TABLET | Freq: Four times a day (QID) | ORAL | Status: DC | PRN

## 2015-06-19 MED ORDER — DOCUSATE SODIUM 100 MG PO CAPS: 100.0000 mg | ORAL_CAPSULE | Freq: Two times a day (BID) | ORAL | Status: DC | PRN

## 2015-06-19 MED ORDER — STERILE WATER FOR IRRIGATION IR SOLN
Status: DC | PRN
  Administered 2015-06-18: 1000 mL

## 2015-06-19 MED ORDER — 0.9 % SODIUM CHLORIDE (POUR BTL) OPTIME
TOPICAL | Status: DC | PRN
  Administered 2015-06-18: 1000 mL

## 2015-06-19 MED ORDER — HYDROCODONE-ACETAMINOPHEN 5-325 MG PO TABS: 1.0000 | ORAL_TABLET | Freq: Four times a day (QID) | ORAL | Status: DC | PRN

## 2015-06-19 NOTE — Discharge Summary (Signed)
Physician Discharge Summary  Patient ID: Natalie Butler MRN: [id number] DOB/AGE: October 16, 1970 44 y.o.  Admit date: 06/18/2015 Discharge date: 06/19/2015  Admission Diagnoses: Dyspareunia ,first-degree uterine descensus  Discharge Diagnoses: Same Active Problems:   Status post vaginal hysterectomy   Discharged Condition: good  Hospital Course: Natalie Butler is a 44 y.o. No obstetric history on file. here for surgical management of dyspareunia due to uterine contact, and first degree uterine descensus. No significant preoperative concerns. She denies significant urinary incontinence, or difficulty with defecation. Endometrial biopsy is benign, Pap smear normal. The patient was admitted through day surgery underwent vaginal hysterectomy with 150 cc blood loss, had Foley catheter and vaginal packing applied. Postoperatively the patient had 1500 cc of urine output overnight,, satisfactory and had pain levels 3. She had mild itching in response to the Dilaudid PCA which was discontinued. Vaginal packing and Foley catheter were removed the following morning she was able to void but described pain as 3 and was sent home on Toradol, with back up Vicodin, and Colace to be taken twice daily  Consults: None  Significant Diagnostic Studies: labs:  CBC Latest Ref Rng 06/19/2015 06/13/2015 09/23/2014  WBC 4.0 - 10.5 K/uL 11.2(H) 10.4 13.6(H)  Hemoglobin 12.0 - 15.0 g/dL 11.9(L) 15.2(H) 16.1(H)  Hematocrit 36.0 - 46.0 % 35.4(L) 42.8 46.0  Platelets 150 - 400 K/uL 180 241 236     Treatments: surgery: Vaginal hysterectomy  Discharge Exam: Blood pressure 123/67, pulse 67, temperature 98.3 F (36.8 C), temperature source Oral, resp. rate 16, height  (1.626 m), weight 155 lb (70.308 kg), last menstrual period 05/31/2015, SpO2 98 %. General appearance: alert, cooperative, appears stated age and no distress Head: Normocephalic, without obvious abnormality, atraumatic GI: soft, non-tender; bowel sounds  normal; no masses,  no organomegaly and Mild abdominal soreness no guarding or rebound Pelvic: external genitalia normal and Packing removed and minimal blood noted  Disposition: 01-Home or Self Care  Discharge Instructions    Call MD for:  persistant nausea and vomiting    Complete by:  As directed      Call MD for:  severe uncontrolled pain    Complete by:  As directed      Call MD for:  temperature >100.4    Complete by:  As directed      Diet - low sodium heart healthy    Complete by:  As directed      Discharge instructions    Complete by:  As directed   Please use stool softener Colace twice daily until normal bowel function achieved     Increase activity slowly    Complete by:  As directed             Medication List    TAKE these medications        docusate sodium 100 MG capsule  Commonly known as:  COLACE  Take 1 capsule (100 mg total) by mouth 2 (two) times daily as needed.     HYDROcodone-acetaminophen 5-325 MG tablet  Commonly known as:  NORCO/VICODIN  Take 1 tablet by mouth every 6 (six) hours as needed for moderate pain or severe pain.     ketorolac 10 MG tablet  Commonly known as:  TORADOL  Take 1 tablet (10 mg total) by mouth every 6 (six) hours as needed (five day limit postop).     lisinopril 2.5 MG tablet  Commonly known as:  PRINIVIL,ZESTRIL  Take 2.5 mg by mouth daily.  VITAMIN D PO  Take 1 tablet by mouth daily.           Follow-up Information    Follow up with Tilda Burrow, MD In 1 week.   Specialties:  Obstetrics and Gynecology, Radiology   Why:  For wound re-check, Postoperative visit   Contact information:   [address] [place] [address] [phone]       Signed: Tilda Burrow 06/19/2015, 8:02 AM

## 2015-06-19 NOTE — Progress Notes (Signed)
Foley catheter removed at 0550.

## 2015-06-19 NOTE — Discharge Instructions (Signed)
Abdominal Hysterectomy, Care After  Most of the instructions for abdominal hysterectomy apply to a vaginal hysterectomy, except that the incision is hidden inside the vagina  Refer to this sheet in the next few weeks. These instructions provide you with information on caring for yourself after your procedure. Your health care provider may also give you more specific instructions. Your treatment has been planned according to current medical practices, but problems sometimes occur. Call your health care provider if you have any problems or questions after your procedure.  WHAT TO EXPECT AFTER THE PROCEDURE After your procedure, it is typical to have the following:  Pain.  Feeling tired.  Poor appetite.  Less interest in sex. It takes 4-6 weeks to recover from this surgery.  HOME CARE INSTRUCTIONS   Take pain medicines only as directed by your health care provider. Do not take over-the-counter pain medicines without checking with your health care provider first.  Change your bandage as directed by your health care provider.  Return to your health care provider to have your sutures taken out.  Take showers instead of baths for 2-3 weeks. Ask your health care provider when it is safe to start showering.  Do not douche, use tampons, or have sexual intercourse for at least 6 weeks or until your health care provider says you can.   Follow your health care provider's advice about exercise, lifting, driving, and general activities.  Get plenty of rest and sleep.   Do not lift anything heavier than a gallon of milk (about 10 lb [4.5 kg]) for the first month after surgery.  You can resume your normal diet if your health care provider says it is okay.   Do not drink alcohol until your health care provider says you can.   If you are constipated, ask your health care provider if you can take a mild laxative.  Eating foods high in fiber may also help with constipation. Eat plenty of raw  fruits and vegetables, whole grains, and beans.  Drink enough fluids to keep your urine clear or pale yellow.   Try to have someone at home with you for the first 1-2 weeks to help around the house.  Keep all follow-up appointments. SEEK MEDICAL CARE IF:   You have chills or fever.  You have swelling, redness, or pain in the area of your incision that is getting worse.   You have pus coming from the incision.   You notice a bad smell coming from the incision or bandage.   Your incision breaks open.   You feel dizzy or light-headed.   You have pain or bleeding when you urinate.   You have persistent diarrhea.   You have persistent nausea and vomiting.   You have abnormal vaginal discharge.   You have a rash.   You have any type of abnormal reaction or develop an allergy to your medicine.   Your pain medicine is not helping.  SEEK IMMEDIATE MEDICAL CARE IF:   You have a fever and your symptoms suddenly get worse.  You have severe abdominal pain.  You have chest pain.  You have shortness of breath.  You faint.  You have pain, swelling, or redness of your leg.  You have heavy vaginal bleeding with blood clots. MAKE SURE YOU:  Understand these instructions.  Will watch your condition.  Will get help right away if you are not doing well or get worse.   This information is not intended to replace advice given  to you by your health care provider. Make sure you discuss any questions you have with your health care provider.   Document Released: 03/20/2005 Document Revised: 09/21/2014 Document Reviewed: 06/23/2013 Elsevier Interactive Patient Education [employer]

## 2015-06-25 ENCOUNTER — Ambulatory Visit (INDEPENDENT_AMBULATORY_CARE_PROVIDER_SITE_OTHER): Payer: BLUE CROSS/BLUE SHIELD | Admitting: Obstetrics and Gynecology

## 2015-06-25 ENCOUNTER — Encounter: Payer: Self-pay | Admitting: Obstetrics and Gynecology

## 2015-06-25 VITALS — BP 120/70 | Ht 64.0 in | Wt 156.5 lb

## 2015-06-25 DIAGNOSIS — Z9889 Other specified postprocedural states: Secondary | ICD-10-CM

## 2015-06-25 DIAGNOSIS — Z4889 Encounter for other specified surgical aftercare: Secondary | ICD-10-CM

## 2015-06-25 NOTE — Progress Notes (Signed)
Patient ID: Natalie Butler, female   DOB: 1971-02-18, 44 y.o.   MRN: [id number]    Subjective:  Natalie Butler is a 44 y.o. female now 1 weeks status post vaginal hysterectomy.    She says that she barely knows she said surgery she still Only. Review of Systems Negative except    Diet:   Regular   Bowel movements : normal.  Pain is controlled with current analgesics. Medications being used: prescription NSAID's including Toradol and a few Percocet.  Objective:  BP 120/70 mmHg  Ht  (1.626 m)  Wt 156 lb 8 oz (70.988 kg)  BMI 26.85 kg/m2  LMP 04/29/2015 General:Well developed, well nourished.  No acute distress. Abdomen: Bowel sounds normal, soft, non-tender. Pelvic Exam:    External Genitalia:  Normal. Light vaginal discharge swabbed away. Tiny bit of bleeding from an excellently reapproximated cuff    Vagina: Normal       Adnexa/Bimanual: Normal  Incision(s):   Healing well on the vaginal cuff light discharge., no drainage, no erythema, no hernia, no swelling, no dehiscence,     Assessment:  Post-Op 1 weeks s/p vaginal hysterectomy   Will treat with MetroGel Doing well postoperatively.   Plan:  1.Wound care discussed   2. . current medications. Had MetroGel. 3. Activity restrictions: No sexual activity, light activity was limited no lifting over 20 pounds until follow-up in 3 weeks 4. return to work: not applicable. 5. Follow up in 3 weeks.

## 2015-06-25 NOTE — Progress Notes (Signed)
Patient ID: Natalie Butler, female   DOB: November 05, 1970, 44 y.o.   MRN: [id number] Pt here today for post op. Pt states she just needs to have a good BM or pass gas.

## 2015-06-25 NOTE — Patient Instructions (Signed)
Continue instructions given in the hospital. Avoid sexual activity

## 2015-06-26 ENCOUNTER — Telehealth: Payer: Self-pay | Admitting: Obstetrics and Gynecology

## 2015-06-26 MED ORDER — METRONIDAZOLE 0.75 % VA GEL: 1.0000 | Freq: Every day | VAGINAL | Status: DC

## 2015-06-26 NOTE — Telephone Encounter (Signed)
Pt aware that Rx has been sent to her pharmacy per Dr. Emelda FearFerguson.

## 2015-07-17 ENCOUNTER — Encounter: Payer: Self-pay | Admitting: Obstetrics and Gynecology

## 2015-07-17 ENCOUNTER — Ambulatory Visit (INDEPENDENT_AMBULATORY_CARE_PROVIDER_SITE_OTHER): Payer: BLUE CROSS/BLUE SHIELD | Admitting: Obstetrics and Gynecology

## 2015-07-17 VITALS — BP 118/70 | Ht 64.0 in | Wt 156.5 lb

## 2015-07-17 DIAGNOSIS — Z90710 Acquired absence of both cervix and uterus: Secondary | ICD-10-CM

## 2015-07-17 DIAGNOSIS — Z9889 Other specified postprocedural states: Secondary | ICD-10-CM

## 2015-07-17 NOTE — Progress Notes (Signed)
Patient ID: Jacklynn BarnacleRobbyn J Butler, female   DOB: 15-May-1971, 44 y.o.   MRN: [id number] Pt here today for post op visit. Pt is four weeks out from surgery and denies any problems or concerns at this time.

## 2015-07-17 NOTE — Progress Notes (Signed)
Patient ID: Natalie BarnacleRobbyn J Dueitt, female   DOB: [birth year], 44 y.o.   MRN: [id number]   Subjective:  Natalie Butler is a 44 y.o. female now 4 weeks status post vaginal hysterectomy. She states she is having no pain at this time. She denies any sexual activity since the surgery. She is still using the Metrogel with no complications.  Review of Systems Negative    Diet:  normal   Bowel movements : normal.  The patient is not having any pain.  Objective:  BP 118/70 mmHg  Ht 5\' 4"  (1.626 m)  Wt 156 lb 8 oz (70.988 kg)  BMI 26.85 kg/m2  LMP 04/29/2015 General:Well developed, well nourished.  No acute distress. Abdomen: Bowel sounds normal, soft, non-tender. Pelvic Exam:    External Genitalia:  Normal.    Vagina: good vaginal length, Some stiffness at vaginal apex still present    Cervix: surgically removed    Uterus: surgically removed    Adnexa/Bimanual: slight fullness at vaginal cuff. Normal post op changes  Incision(s):   Healing well, no drainage, no erythema, no hernia, no swelling, no dehiscence,     Assessment:  Post-Op 4 weeks s/p vaginal hysterectomy   Doing well postoperatively.   Plan:  1. Continue Metrogel 2.. Activity restrictions: may resume normal physical activity  3. Can resume sexual activity at 6 weeks 4. return to work: now. 5. Follow up in 1 year, PRN  By signing my name below, I, Jarvis Morganaylor Temprence Rhines, attest that this documentation has been prepared under the direction and in the presence of Tilda BurrowJohn Javoris Star V, MD. Electronically Signed: Jarvis Morganaylor Sicily Zaragoza, ED Scribe. 07/17/2015. 9:58 AM.  I personally performed the services described in this documentation, which was SCRIBED in my presence. The recorded information has been reviewed and considered accurate. It has been edited as necessary during review. Tilda BurrowFERGUSON,Nita Whitmire V, MD

## 2015-07-30 NOTE — Telephone Encounter (Signed)
SURGERY COMPLETED

## 2016-07-23 ENCOUNTER — Encounter: Payer: Self-pay | Admitting: Internal Medicine

## 2016-08-07 IMAGING — CT CT HEAD W/O CM
1 of 7 series · 3 of 47 positions shown, 4 images · non-contrast
Comparison: None.

CLINICAL DATA: Right-sided head neck pain after fall.

EXAM:
CT HEAD WITHOUT CONTRAST
CT CERVICAL SPINE WITHOUT CONTRAST
TECHNIQUE: Multidetector CT imaging of the head and cervical spine was
performed following the standard protocol without intravenous
contrast. Multiplanar CT image reconstructions of the cervical spine
were also generated.

[Series 307: coronals · coronal · 0.47mm/px · 3 of 35 slices shown, 4 images]
[im 1/35  brain]
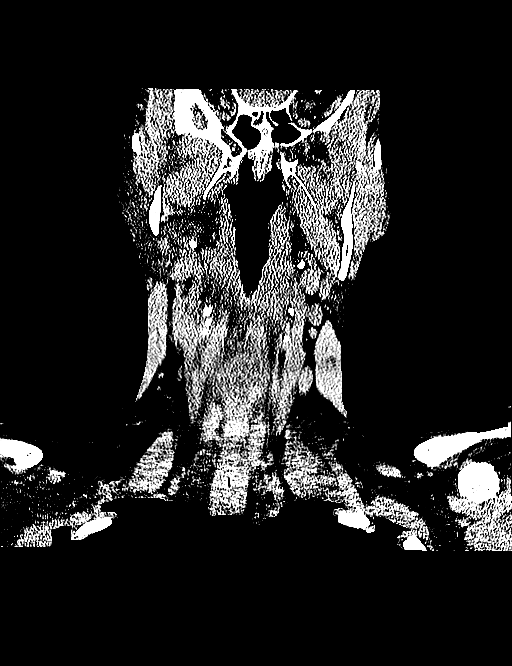
[im 1/35  bone]
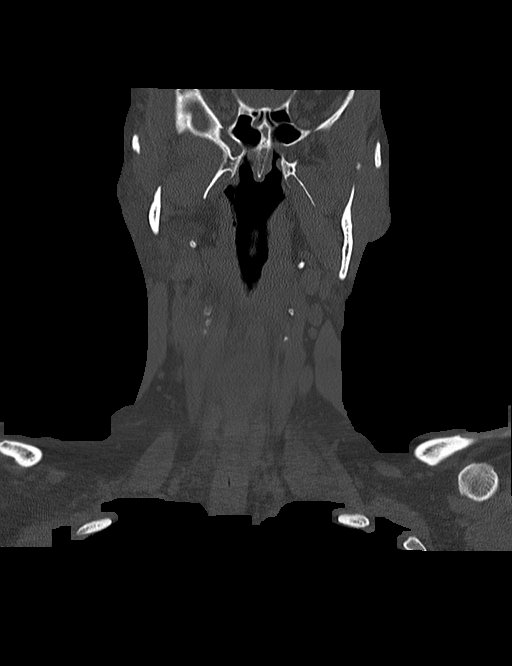
[im 18/35  brain]
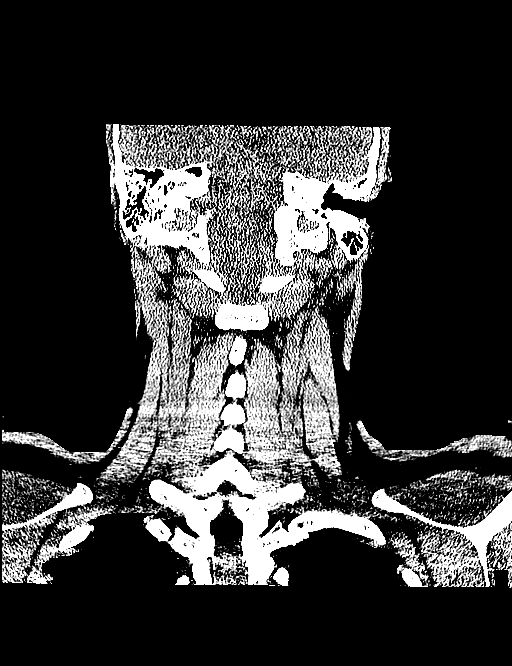
[im 35/35  brain]
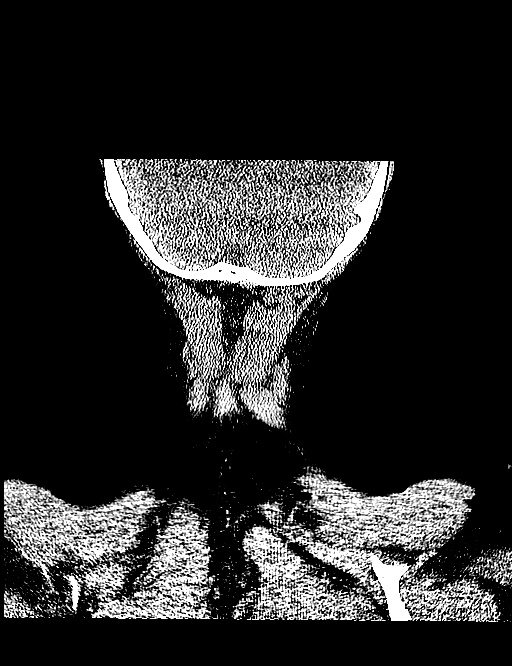

[3 of 47 positions shown; findings below may reference images not displayed]

FINDINGS: CT HEAD FINDINGS

No intracranial hemorrhage, mass effect, or midline shift. No
hydrocephalus. The basilar cisterns are patent. No evidence of
territorial infarct. No intracranial fluid collection. Mild atrophy
for age. Calvarium is intact. Included paranasal sinuses and mastoid
air cells are well aerated.

CT CERVICAL SPINE FINDINGS

Cervical spine alignment is maintained. Vertebral body heights are
preserved. There is no fracture. The dens is intact. Mild disc space
narrowing at C5-C6 and C6-C7 with associated endplate spurs. There
is multilevel facet arthropathy throughout the cervical spine, right
greater than left. There are no jumped or perched facets. No
prevertebral soft tissue edema. Emphysema and mild biapical pleural
parenchymal scarring is noted at the lung apices.
IMPRESSION: 1.  No acute intracranial abnormality.
2. Multilevel degenerative change throughout the cervical spine
without acute fracture or subluxation.
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
## 2016-09-30 ENCOUNTER — Ambulatory Visit: Payer: BLUE CROSS/BLUE SHIELD | Admitting: Gastroenterology

## 2016-10-22 ENCOUNTER — Encounter: Payer: Self-pay | Admitting: Gastroenterology

## 2016-10-22 ENCOUNTER — Other Ambulatory Visit: Payer: Self-pay

## 2016-10-22 ENCOUNTER — Ambulatory Visit (INDEPENDENT_AMBULATORY_CARE_PROVIDER_SITE_OTHER): Payer: BLUE CROSS/BLUE SHIELD | Admitting: Gastroenterology

## 2016-10-22 DIAGNOSIS — R1013 Epigastric pain: Secondary | ICD-10-CM

## 2016-10-22 DIAGNOSIS — G8929 Other chronic pain: Secondary | ICD-10-CM | POA: Diagnosis not present

## 2016-10-22 NOTE — Assessment & Plan Note (Addendum)
46 year old female with 2 year history of progressive epigastric burning with and without food, worse with meals, nocturnal symptoms. Initially responded to over-the-counter Prevacid as well as other over-the-counter antacids but no longer helping. Symptoms may be secondary to GERD, gastritis, peptic ulcer disease. Plan for EGD in the near future with Dr. Jena Gaussourk. Augment conscious sedation with Phenergan 25 mg IV 45 minutes before the procedure.  I have discussed the risks, alternatives, benefits with regards to but not limited to the risk of reaction to medication, bleeding, infection, perforation and the patient is agreeable to proceed. Written consent to be obtained.  Patient wants to hold off on PPI change prior to EGD. Currently using OTC antacids without relief.

## 2016-10-22 NOTE — Progress Notes (Signed)
Primary Care Physician:  Wilson SingerGOSRANI,NIMISH C, MD  Primary Gastroenterologist:  Roetta SessionsMichael Rourk, MD   Chief Complaint  Patient presents with  . Gastroesophageal Reflux    HPI:  Natalie Butler is a 46 y.o. female here At the request of Dr. Karilyn CotaGosrani for further evaluation of epigastric pain. Patient states she's having symptoms for a couple of years. Denies typical heartburn. She has burning in the epigastrium almost constantly, definitely worse with meals. Wakes up every night around 1 AM with symptoms, generally wakes up couple times each night. Also wakes up with regurgitation. No vomiting or dysphagia. No unintentional weight loss. No NSAIDs or aspirin. She had been on over-the-counter Prevacid chronically, stopped working. Uses Pepto on occasion when severe burning. Doesn't seem to matter what she eats or drink. Has epigastric burning with water. Sometimes she feels like eating or drinking will help and it may for a few minutes and then into his return. Bowel movements are regular. No blood in the stool or melena.  She drinks Smirnoff ice 4 times per week when she plays pool with friends. Denies heavy use.  No prior EGD or colonoscopy.    Current Outpatient Prescriptions  Medication Sig Dispense Refill  . lisinopril (PRINIVIL,ZESTRIL) 2.5 MG tablet Take 2.5 mg by mouth daily.    . Cholecalciferol (VITAMIN D PO) Take 1 tablet by mouth daily.    . metroNIDAZOLE (METROGEL VAGINAL) 0.75 % vaginal gel Place 1 Applicatorful vaginally at bedtime. (Patient not taking: Reported on 10/22/2016) 70 g 0   No current facility-administered medications for this visit.     Allergies as of 10/22/2016  . (No Known Allergies)    Past Medical History:  Diagnosis Date  . Hypertension   . Mitral valve regurgitation 03/2015  . Vitamin D deficiency     Past Surgical History:  Procedure Laterality Date  . ABDOMINAL HYSTERECTOMY    . CYST EXCISION     arm  . TUBAL LIGATION    . VAGINAL HYSTERECTOMY N/A  06/18/2015   Procedure: HYSTERECTOMY VAGINAL;  Surgeon: Tilda BurrowJohn Ferguson V, MD;  Location: [facility];  Service: Gynecology;  Laterality: N/A;    Family History  Problem Relation Age of Onset  . Diabetes Mother   . Hypertension Mother   . Hyperlipidemia Mother   . Colon cancer Neg Hx     Social History   Social History  . Marital status: Married    Spouse name: N/A  . Number of children: N/A  . Years of education: N/A   Occupational History  . Not on file.   Social History Main Topics  . Smoking status: Current Every Day Smoker    Packs/day: 0.50    Years: 30.00    Types: Cigarettes  . Smokeless tobacco: Never Used  . Alcohol use 4.2 oz/week    7 Glasses of wine per week  . Drug use: No  . Sexual activity: Not Currently    Birth control/ protection: Surgical   Other Topics Concern  . Not on file   Social History Narrative  . No narrative on file      ROS:  General: Negative for anorexia, weight loss, fever, chills, fatigue, weakness. Eyes: Negative for vision changes.  ENT: Negative for hoarseness, difficulty swallowing , nasal congestion. CV: Negative for chest pain, angina, palpitations, dyspnea on exertion, peripheral edema.  Respiratory: Negative for dyspnea at rest, dyspnea on exertion, cough, sputum, wheezing.  GI: See history of present illness. GU:  Negative for dysuria, hematuria, urinary  incontinence, urinary frequency, nocturnal urination.  MS: Negative for joint pain, low back pain.  Derm: Negative for rash or itching.  Neuro: Negative for weakness, abnormal sensation, seizure, frequent headaches, memory loss, confusion.  Psych: Negative for anxiety, depression, suicidal ideation, hallucinations.  Endo: Negative for unusual weight change.  Heme: Negative for bruising or bleeding. Allergy: Negative for rash or hives.    Physical Examination:  BP (!) 141/92   Pulse 89   Temp 97.8 F (36.6 C) (Oral)   Ht 5\' 4"  (1.626 m)   Wt 176 lb 3.2 oz (79.9 kg)    LMP 05/31/2015 Comment: 06/13/2015 Serum Pregnancy negative  BMI 30.24 kg/m    General: Well-nourished, well-developed in no acute distress.  Head: Normocephalic, atraumatic.   Eyes: Conjunctiva pink, no icterus. Mouth: Oropharyngeal mucosa moist and pink , no lesions erythema or exudate. Neck: Supple without thyromegaly, masses, or lymphadenopathy.  Lungs: Clear to auscultation bilaterally.  Heart: Regular rate and rhythm, no murmurs rubs or gallops.  Abdomen: Bowel sounds are normal, nontender, nondistended, no hepatosplenomegaly or masses, no abdominal bruits or    hernia , no rebound or guarding.   Rectal: Not performed Extremities: No lower extremity edema. No clubbing or deformities.  Neuro: Alert and oriented x 4 , grossly normal neurologically.  Skin: Warm and dry, no rash or jaundice.   Psych: Alert and cooperative, normal mood and affect.

## 2016-10-22 NOTE — Patient Instructions (Signed)
1. Upper endoscopy as planned. Please see separate instructions.  Food Choices for Peptic Ulcer Disease When you have peptic ulcer disease, the foods you eat and your eating habits are very important. Choosing the right foods can help ease the discomfort of peptic ulcer disease. What general guidelines do I need to follow?  Choose fruits, vegetables, whole grains, and low-fat meat, fish, and poultry.  Keep a food diary to identify foods that cause symptoms.  Avoid foods that cause irritation or pain. These may be different for different people.  Eat frequent small meals instead of three large meals each day. The pain may be worse when your stomach is empty.  Avoid eating close to bedtime. What foods are not recommended? The following are some foods and drinks that may worsen your symptoms:  Black, white, and red pepper.  Hot sauce.  Chili peppers.  Chili powder.  Chocolate and cocoa.  Alcohol.  Tea, coffee, and cola (regular and decaffeinated). The items listed above may not be a complete list of foods and beverages to avoid. Contact your dietitian for more information.  This information is not intended to replace advice given to you by your health care provider. Make sure you discuss any questions you have with your health care provider. Document Released: 11/23/2011 Document Revised: 02/06/2016 Document Reviewed: 07/05/2013 Elsevier Interactive Patient Education  2017 [employer]

## 2016-10-22 NOTE — Progress Notes (Signed)
cc'ed to pcp °

## 2016-10-28 ENCOUNTER — Encounter (HOSPITAL_COMMUNITY): Payer: Self-pay | Admitting: *Deleted

## 2016-10-28 ENCOUNTER — Ambulatory Visit (HOSPITAL_COMMUNITY)
Admission: RE | Admit: 2016-10-28 | Discharge: 2016-10-28 | Disposition: A | Discharge status: Home / Self Care | Payer: BLUE CROSS/BLUE SHIELD | Source: Ambulatory Visit | Attending: Internal Medicine | Admitting: Internal Medicine

## 2016-10-28 ENCOUNTER — Encounter (HOSPITAL_COMMUNITY)
Admission: RE | Disposition: A | Discharge status: Home / Self Care | Payer: Self-pay | Source: Ambulatory Visit | Attending: Internal Medicine

## 2016-10-28 ENCOUNTER — Telehealth: Payer: Self-pay | Admitting: Internal Medicine

## 2016-10-28 DIAGNOSIS — Z79899 Other long term (current) drug therapy: Secondary | ICD-10-CM | POA: Insufficient documentation

## 2016-10-28 DIAGNOSIS — E559 Vitamin D deficiency, unspecified: Secondary | ICD-10-CM | POA: Diagnosis not present

## 2016-10-28 DIAGNOSIS — K449 Diaphragmatic hernia without obstruction or gangrene: Secondary | ICD-10-CM | POA: Diagnosis not present

## 2016-10-28 DIAGNOSIS — I10 Essential (primary) hypertension: Secondary | ICD-10-CM | POA: Insufficient documentation

## 2016-10-28 DIAGNOSIS — K210 Gastro-esophageal reflux disease with esophagitis: Secondary | ICD-10-CM | POA: Insufficient documentation

## 2016-10-28 DIAGNOSIS — F17210 Nicotine dependence, cigarettes, uncomplicated: Secondary | ICD-10-CM | POA: Diagnosis not present

## 2016-10-28 DIAGNOSIS — R1013 Epigastric pain: Secondary | ICD-10-CM

## 2016-10-28 DIAGNOSIS — K209 Esophagitis, unspecified: Secondary | ICD-10-CM | POA: Diagnosis not present

## 2016-10-28 HISTORY — PX: ESOPHAGOGASTRODUODENOSCOPY: SHX5428

## 2016-10-28 SURGERY — EGD (ESOPHAGOGASTRODUODENOSCOPY)
Anesthesia: Moderate Sedation

## 2016-10-28 MED ORDER — SODIUM CHLORIDE 0.9% FLUSH
INTRAVENOUS | Status: AC
  Filled 2016-10-28: qty 10

## 2016-10-28 MED ORDER — MIDAZOLAM HCL 5 MG/5ML IJ SOLN
INTRAMUSCULAR | Status: DC | PRN
  Administered 2016-10-28: 1 mg via INTRAVENOUS
  Administered 2016-10-28: 2 mg via INTRAVENOUS

## 2016-10-28 MED ORDER — SODIUM CHLORIDE 0.9 % IV SOLN
INTRAVENOUS | Status: DC
  Administered 2016-10-28: 08:00:00 via INTRAVENOUS

## 2016-10-28 MED ORDER — PROMETHAZINE HCL 25 MG/ML IJ SOLN
25.0000 mg | Freq: Once | INTRAMUSCULAR | Status: AC
  Administered 2016-10-28: 25 mg via INTRAVENOUS

## 2016-10-28 MED ORDER — MEPERIDINE HCL 100 MG/ML IJ SOLN
INTRAMUSCULAR | Status: AC
  Filled 2016-10-28: qty 2

## 2016-10-28 MED ORDER — ONDANSETRON HCL 4 MG/2ML IJ SOLN
INTRAMUSCULAR | Status: AC
  Filled 2016-10-28: qty 2

## 2016-10-28 MED ORDER — LIDOCAINE VISCOUS 2 % MT SOLN
OROMUCOSAL | Status: AC
  Filled 2016-10-28: qty 15

## 2016-10-28 MED ORDER — PROMETHAZINE HCL 25 MG/ML IJ SOLN
INTRAMUSCULAR | Status: AC
  Filled 2016-10-28: qty 1

## 2016-10-28 MED ORDER — MIDAZOLAM HCL 5 MG/5ML IJ SOLN
INTRAMUSCULAR | Status: AC
  Filled 2016-10-28: qty 10

## 2016-10-28 MED ORDER — LIDOCAINE VISCOUS 2 % MT SOLN
OROMUCOSAL | Status: DC | PRN
  Administered 2016-10-28: 1 via OROMUCOSAL

## 2016-10-28 MED ORDER — STERILE WATER FOR IRRIGATION IR SOLN
Status: DC | PRN
  Administered 2016-10-28: 2.5 mL

## 2016-10-28 MED ORDER — MEPERIDINE HCL 100 MG/ML IJ SOLN
INTRAMUSCULAR | Status: DC | PRN
  Administered 2016-10-28: 50 mg via INTRAVENOUS

## 2016-10-28 NOTE — H&P (View-Only) (Signed)
Primary Care Physician:  Wilson SingerGOSRANI,NIMISH C, MD  Primary Gastroenterologist:  Roetta SessionsMichael Rourk, MD   Chief Complaint  Patient presents with  . Gastroesophageal Reflux    HPI:  Jacklynn BarnacleRobbyn J Wool is a 46 y.o. female here At the request of Dr. Karilyn CotaGosrani for further evaluation of epigastric pain. Patient states she's having symptoms for a couple of years. Denies typical heartburn. She has burning in the epigastrium almost constantly, definitely worse with meals. Wakes up every night around 1 AM with symptoms, generally wakes up couple times each night. Also wakes up with regurgitation. No vomiting or dysphagia. No unintentional weight loss. No NSAIDs or aspirin. She had been on over-the-counter Prevacid chronically, stopped working. Uses Pepto on occasion when severe burning. Doesn't seem to matter what she eats or drink. Has epigastric burning with water. Sometimes she feels like eating or drinking will help and it may for a few minutes and then into his return. Bowel movements are regular. No blood in the stool or melena.  She drinks Smirnoff ice 4 times per week when she plays pool with friends. Denies heavy use.  No prior EGD or colonoscopy.    Current Outpatient Prescriptions  Medication Sig Dispense Refill  . lisinopril (PRINIVIL,ZESTRIL) 2.5 MG tablet Take 2.5 mg by mouth daily.    . Cholecalciferol (VITAMIN D PO) Take 1 tablet by mouth daily.    . metroNIDAZOLE (METROGEL VAGINAL) 0.75 % vaginal gel Place 1 Applicatorful vaginally at bedtime. (Patient not taking: Reported on 10/22/2016) 70 g 0   No current facility-administered medications for this visit.     Allergies as of 10/22/2016  . (No Known Allergies)    Past Medical History:  Diagnosis Date  . Hypertension   . Mitral valve regurgitation 03/2015  . Vitamin D deficiency     Past Surgical History:  Procedure Laterality Date  . ABDOMINAL HYSTERECTOMY    . CYST EXCISION     arm  . TUBAL LIGATION    . VAGINAL HYSTERECTOMY N/A  06/18/2015   Procedure: HYSTERECTOMY VAGINAL;  Surgeon: Tilda BurrowJohn Ferguson V, MD;  Location: [facility];  Service: Gynecology;  Laterality: N/A;    Family History  Problem Relation Age of Onset  . Diabetes Mother   . Hypertension Mother   . Hyperlipidemia Mother   . Colon cancer Neg Hx     Social History   Social History  . Marital status: Married    Spouse name: N/A  . Number of children: N/A  . Years of education: N/A   Occupational History  . Not on file.   Social History Main Topics  . Smoking status: Current Every Day Smoker    Packs/day: 0.50    Years: 30.00    Types: Cigarettes  . Smokeless tobacco: Never Used  . Alcohol use 4.2 oz/week    7 Glasses of wine per week  . Drug use: No  . Sexual activity: Not Currently    Birth control/ protection: Surgical   Other Topics Concern  . Not on file   Social History Narrative  . No narrative on file      ROS:  General: Negative for anorexia, weight loss, fever, chills, fatigue, weakness. Eyes: Negative for vision changes.  ENT: Negative for hoarseness, difficulty swallowing , nasal congestion. CV: Negative for chest pain, angina, palpitations, dyspnea on exertion, peripheral edema.  Respiratory: Negative for dyspnea at rest, dyspnea on exertion, cough, sputum, wheezing.  GI: See history of present illness. GU:  Negative for dysuria, hematuria, urinary  incontinence, urinary frequency, nocturnal urination.  MS: Negative for joint pain, low back pain.  Derm: Negative for rash or itching.  Neuro: Negative for weakness, abnormal sensation, seizure, frequent headaches, memory loss, confusion.  Psych: Negative for anxiety, depression, suicidal ideation, hallucinations.  Endo: Negative for unusual weight change.  Heme: Negative for bruising or bleeding. Allergy: Negative for rash or hives.    Physical Examination:  BP (!) 141/92   Pulse 89   Temp 97.8 F (36.6 C) (Oral)   Ht 5\' 4"  (1.626 m)   Wt 176 lb 3.2 oz (79.9 kg)    LMP 05/31/2015 Comment: 06/13/2015 Serum Pregnancy negative  BMI 30.24 kg/m    General: Well-nourished, well-developed in no acute distress.  Head: Normocephalic, atraumatic.   Eyes: Conjunctiva pink, no icterus. Mouth: Oropharyngeal mucosa moist and pink , no lesions erythema or exudate. Neck: Supple without thyromegaly, masses, or lymphadenopathy.  Lungs: Clear to auscultation bilaterally.  Heart: Regular rate and rhythm, no murmurs rubs or gallops.  Abdomen: Bowel sounds are normal, nontender, nondistended, no hepatosplenomegaly or masses, no abdominal bruits or    hernia , no rebound or guarding.   Rectal: Not performed Extremities: No lower extremity edema. No clubbing or deformities.  Neuro: Alert and oriented x 4 , grossly normal neurologically.  Skin: Warm and dry, no rash or jaundice.   Psych: Alert and cooperative, normal mood and affect.

## 2016-10-28 NOTE — Telephone Encounter (Signed)
Pt had procedure this morning and will be coming by the office to pick up Dexilant samples.

## 2016-10-28 NOTE — Discharge Instructions (Signed)
Colonoscopy Discharge Instructions  Read the instructions outlined below and refer to this sheet in the next few weeks. These discharge instructions provide you with general information on caring for yourself after you leave the hospital. Your doctor may also give you specific instructions. While your treatment has been planned according to the most current medical practices available, unavoidable complications occasionally occur. If you have any problems or questions after discharge, call Dr. Jena Gauss at [phone]. ACTIVITY  You may resume your regular activity, but move at a slower pace for the next 24 hours.   Take frequent rest periods for the next 24 hours.   Walking will help get rid of the air and reduce the bloated feeling in your belly (abdomen).   No driving for 24 hours (because of the medicine (anesthesia) used during the test).    Do not sign any important legal documents or operate any machinery for 24 hours (because of the anesthesia used during the test).  NUTRITION  Drink plenty of fluids.   You may resume your normal diet as instructed by your doctor.   Begin with a light meal and progress to your normal diet. Heavy or fried foods are harder to digest and may make you feel sick to your stomach (nauseated).   Avoid alcoholic beverages for 24 hours or as instructed.  MEDICATIONS  You may resume your normal medications unless your doctor tells you otherwise.  WHAT YOU CAN EXPECT TODAY  Some feelings of bloating in the abdomen.   Passage of more gas than usual.   Spotting of blood in your stool or on the toilet paper.  IF YOU HAD POLYPS REMOVED DURING THE COLONOSCOPY:  No aspirin products for 7 days or as instructed.   No alcohol for 7 days or as instructed.   Eat a soft diet for the next 24 hours.  FINDING OUT THE RESULTS OF YOUR TEST Not all test results are available during your visit. If your test results are not back during the visit, make an appointment  with your caregiver to find out the results. Do not assume everything is normal if you have not heard from your caregiver or the medical facility. It is important for you to follow up on all of your test results.  SEEK IMMEDIATE MEDICAL ATTENTION IF:  You have more than a spotting of blood in your stool.   Your belly is swollen (abdominal distention).   You are nauseated or vomiting.   You have a temperature over 101.   You have abdominal pain or discomfort that is severe or gets worse throughout the day.    GERD information provided  Begin Dexilant 60 mg daily. Prescription provided. Go by my office for 3 week supply of free samples before filling the prescription  Stop over-the-counter Prevacid.  Office visit with [place] in 3 months    Gastroesophageal Reflux Disease, Adult Normally, food travels down the esophagus and stays in the stomach to be digested. However, when a person has gastroesophageal reflux disease (GERD), food and stomach acid move back up into the esophagus. When this happens, the esophagus becomes sore and inflamed. Over time, GERD can create small holes (ulcers) in the lining of the esophagus. What are the causes? This condition is caused by a problem with the muscle between the esophagus and the stomach (lower esophageal sphincter, or LES). Normally, the LES muscle closes after food passes through the esophagus to the stomach. When the LES is weakened or abnormal, it does not  close properly, and that allows food and stomach acid to go back up into the esophagus. The LES can be weakened by certain dietary substances, medicines, and medical conditions, including:  Tobacco use.  Pregnancy.  Having a hiatal hernia.  Heavy alcohol use.  Certain foods and beverages, such as coffee, chocolate, onions, and peppermint. What increases the risk? This condition is more likely to develop in:  People who have an increased body weight.  People who have connective tissue  disorders.  People who use NSAID medicines. What are the signs or symptoms? Symptoms of this condition include:  Heartburn.  Difficult or painful swallowing.  The feeling of having a lump in the throat.  Abitter taste in the mouth.  Bad breath.  Having a large amount of saliva.  Having an upset or bloated stomach.  Belching.  Chest pain.  Shortness of breath or wheezing.  Ongoing (chronic) cough or a night-time cough.  Wearing away of tooth enamel.  Weight loss. Different conditions can cause chest pain. Make sure to see your health care provider if you experience chest pain. How is this diagnosed? Your health care provider will take a medical history and perform a physical exam. To determine if you have mild or severe GERD, your health care provider may also monitor how you respond to treatment. You may also have other tests, including:  An endoscopy toexamine your stomach and esophagus with a small camera.  A test thatmeasures the acidity level in your esophagus.  A test thatmeasures how much pressure is on your esophagus.  A barium swallow or modified barium swallow to show the shape, size, and functioning of your esophagus. How is this treated? The goal of treatment is to help relieve your symptoms and to prevent complications. Treatment for this condition may vary depending on how severe your symptoms are. Your health care provider may recommend:  Changes to your diet.  Medicine.  Surgery. Follow these instructions at home: Diet  Follow a diet as recommended by your health care provider. This may involve avoiding foods and drinks such as:  Coffee and tea (with or without caffeine).  Drinks that containalcohol.  Energy drinks and sports drinks.  Carbonated drinks or sodas.  Chocolate and cocoa.  Peppermint and mint flavorings.  Garlic and onions.  Horseradish.  Spicy and acidic foods, including peppers, chili powder, curry powder,  vinegar, hot sauces, and barbecue sauce.  Citrus fruit juices and citrus fruits, such as oranges, lemons, and limes.  Tomato-based foods, such as red sauce, chili, salsa, and pizza with red sauce.  Fried and fatty foods, such as donuts, french fries, potato chips, and high-fat dressings.  High-fat meats, such as hot dogs and fatty cuts of red and white meats, such as rib eye steak, sausage, ham, and bacon.  High-fat dairy items, such as whole milk, butter, and cream cheese.  Eat small, frequent meals instead of large meals.  Avoid drinking large amounts of liquid with your meals.  Avoid eating meals during the 2-3 hours before bedtime.  Avoid lying down right after you eat.  Do not exercise right after you eat. General instructions  Pay attention to any changes in your symptoms.  Take over-the-counter and prescription medicines only as told by your health care provider. Do not take aspirin, ibuprofen, or other NSAIDs unless your health care provider told you to do so.  Do not use any tobacco products, including cigarettes, chewing tobacco, and e-cigarettes. If you need help quitting, ask your health  care provider.  Wear loose-fitting clothing. Do not wear anything tight around your waist that causes pressure on your abdomen.  Raise (elevate) the head of your bed 6 inches (15cm).  Try to reduce your stress, such as with yoga or meditation. If you need help reducing stress, ask your health care provider.  If you are overweight, reduce your weight to an amount that is healthy for you. Ask your health care provider for guidance about a safe weight loss goal.  Keep all follow-up visits as told by your health care provider. This is important. Contact a health care provider if:  You have new symptoms.  You have unexplained weight loss.  You have difficulty swallowing, or it hurts to swallow.  You have wheezing or a persistent cough.  Your symptoms do not improve with  treatment.  You have a hoarse voice. Get help right away if:  You have pain in your arms, neck, jaw, teeth, or back.  You feel sweaty, dizzy, or light-headed.  You have chest pain or shortness of breath.  You vomit and your vomit looks like blood or coffee grounds.  You faint.  Your stool is bloody or black.  You cannot swallow, drink, or eat. This information is not intended to replace advice given to you by your health care provider. Make sure you discuss any questions you have with your health care provider. Document Released: 06/10/2005 Document Revised: 01/29/2016 Document Reviewed: 12/26/2014 Elsevier Interactive Patient Education  2017 [employer]  GO BY OFFICE TO PICK UP SAMPLES OF DEXILANT 60mg

## 2016-10-28 NOTE — Telephone Encounter (Signed)
Samples are at the front desk. 

## 2016-10-28 NOTE — Op Note (Signed)
[facility] Patient Name: Natalie Butler Procedure Date: 10/28/2016 8:35 AM MRN: [id number] Date of Birth: 1970-12-01 Attending MD: Gennette Pac , MD CSN: [id number] Age: 46 Admit Type: Outpatient Procedure:                Upper GI endoscopy - diagnostic Indications:              Epigastric abdominal pain Providers:                Gennette Pac, MD, Nena Polio, RN, Lollie Marrow.                            [place], Pensions consultant Referring MD:              Medicines:                Midazolam 3 mg IV, Meperidine 50 mg IV,                            Promethazine 25 mg IV Complications:            No immediate complications. Estimated Blood Loss:     Estimated blood loss: none. Procedure:                Pre-Anesthesia Assessment:                           - Prior to the procedure, a History and Physical                            was performed, and patient medications and                            allergies were reviewed. The patient's tolerance of                            previous anesthesia was also reviewed. The risks                            and benefits of the procedure and the sedation                            options and risks were discussed with the patient.                            All questions were answered, and informed consent                            was obtained. Prior Anticoagulants: The patient has                            taken no previous anticoagulant or antiplatelet                            agents. ASA Grade Assessment: II - A patient with  mild systemic disease. After reviewing the risks                            and benefits, the patient was deemed in                            satisfactory condition to undergo the procedure.                           After obtaining informed consent, the endoscope was                            passed under direct vision. Throughout the                            procedure, the  patient's blood pressure, pulse, and                            oxygen saturations were monitored continuously. The                            [id number] ([id number]) scope was introduced through the                            mouth, and advanced to the second part of duodenum.                            The upper GI endoscopy was accomplished without                            difficulty. The patient tolerated the procedure                            well. Scope In: 8:47:07 AM Scope Out: 8:51:39 AM Total Procedure Duration: 0 hours 4 minutes 32 seconds  Findings:      LA Grade B (one or more mucosal breaks greater than 5 mm, not extending       between the tops of two mucosal folds) esophagitis with no bleeding was       found 35 to 38 cm from the incisors. No Barrett's epithelium seen.      A small hiatal hernia was present. stomach otherwise appeared normal.      The duodenal bulb and second portion of the duodenum were normal. Impression:               - LA Grade B esophagitis.                           - Small hiatal hernia. Otherwise, normal stomach.                           - Normal duodenal bulb and second portion of the                            duodenum.                           -  No specimens collected. Moderate Sedation:      Moderate (conscious) sedation was administered by the endoscopy nurse       and supervised by the endoscopist. The following parameters were       monitored: oxygen saturation, heart rate, blood pressure, respiratory       rate, EKG, adequacy of pulmonary ventilation, and response to care.       Total physician intraservice time was 11 minutes. Recommendation:           - Patient has a contact number available for                            emergencies. The signs and symptoms of potential                            delayed complications were discussed with the                            patient. Return to normal activities tomorrow.                             Written discharge instructions were provided to the                            patient.                           - Resume previous diet. Stop OTC Prevacid. Office                            visit in 3 months                           - Use Dexilant (dexlansoprazole) 60 mg PO daily.                           - Patient has a contact number available for                            emergencies. The signs and symptoms of potential                            delayed complications were discussed with the                            patient. Return to normal activities tomorrow.                            Written discharge instructions were provided to the                            patient.                           - Resume previous diet.                           -  Continue present medications.                           - No repeat upper endoscopy.                           - Return to GI office in 3 months. Procedure Code(s):        --- Professional ---                           [phone], Esophagogastroduodenoscopy, flexible,                            transoral; diagnostic, including collection of                            specimen(s) by brushing or washing, when performed                            (separate procedure)                           99152, Moderate sedation services provided by the                            same physician or other qualified health care                            professional performing the diagnostic or                            therapeutic service that the sedation supports,                            requiring the presence of an independent trained                            observer to assist in the monitoring of the                            patient's level of consciousness and physiological                            status; initial 15 minutes of intraservice time,                            patient age 36 years or older Diagnosis Code(s):        ---  Professional ---                           K20.9, Esophagitis, unspecified                           K44.9, Diaphragmatic hernia without obstruction or  gangrene                           R10.13, Epigastric pain CPT copyright 2016 [employer]. All rights reserved. The codes documented in this report are preliminary and upon coder review may  be revised to meet current compliance requirements. Gerrit Friendsobert M. Loney Peto, MD Gennette Pacobert Michael Braidyn Scorsone, MD 10/28/2016 9:06:48 AM This report has been signed electronically. Number of Addenda: 0

## 2016-10-28 NOTE — Interval H&P Note (Signed)
History and Physical Interval Note:  10/28/2016 8:34 AM  Natalie Butler  has presented today for surgery, with the diagnosis of epigastric pain  The various methods of treatment have been discussed with the patient and family. After consideration of risks, benefits and other options for treatment, the patient has consented to  Procedure(s) with comments: ESOPHAGOGASTRODUODENOSCOPY (EGD) (N/A) - 8:30am as a surgical intervention .  The patient's history has been reviewed, patient examined, no change in status, stable for surgery.  I have reviewed the patient's chart and labs.  Questions were answered to the patient's satisfaction.     Brianny Soulliere  No change. Patient denies dysphagia. Diagnostic EGD per plan.  The risks, benefits, limitations, alternatives and imponderables have been reviewed with the patient. Potential for esophageal dilation, biopsy, etc. have also been reviewed.  Questions have been answered. All parties agreeable.

## 2016-11-04 ENCOUNTER — Encounter (HOSPITAL_COMMUNITY): Payer: Self-pay | Admitting: Internal Medicine

## 2016-11-11 ENCOUNTER — Telehealth: Payer: Self-pay | Admitting: Internal Medicine

## 2016-11-11 NOTE — Telephone Encounter (Signed)
Pt called to say that her insurance will not cover dexilant. I told her to call her pharmacy and have them fax us the PA forms and JL is aware.

## 2016-11-11 NOTE — Telephone Encounter (Signed)
Received fax from the pharmacy. Working on [employer]

## 2016-11-11 NOTE — Telephone Encounter (Signed)
, °

## 2016-11-13 ENCOUNTER — Telehealth: Payer: Self-pay | Admitting: Internal Medicine

## 2016-11-13 NOTE — Telephone Encounter (Signed)
[phone]   Please call patient about her prescription of dexilant, insurance is not covering all of the cost and she may need an alternative

## 2016-11-16 NOTE — Telephone Encounter (Signed)
Tried to call pt- NA-LMOM- informing her that if it was the copay on the dexilant,. We could try a copay card. Asked her to call me back or send a message in [place].

## 2016-11-17 NOTE — Telephone Encounter (Signed)
I have also sent the pt a message in mychart.

## 2016-11-17 NOTE — Telephone Encounter (Signed)
Pt responded in mychart. Mailing copay card to the pt. See mychart note.

## 2017-01-27 ENCOUNTER — Encounter: Payer: Self-pay | Admitting: Gastroenterology

## 2017-01-27 ENCOUNTER — Ambulatory Visit: Payer: BLUE CROSS/BLUE SHIELD | Admitting: Gastroenterology
